# Patient Record
Sex: Male | Born: 1960 | Race: White | Hispanic: No | Marital: Married | Smoking: Current every day smoker
Health system: Southern US, Community
[De-identification: ages and names within clinical notes are randomized; demographics above are authoritative.]

## PROBLEM LIST (undated history)

## (undated) DIAGNOSIS — K2270 Barrett's esophagus without dysplasia: Secondary | ICD-10-CM

## (undated) DIAGNOSIS — K219 Gastro-esophageal reflux disease without esophagitis: Secondary | ICD-10-CM

## (undated) DIAGNOSIS — T8859XA Other complications of anesthesia, initial encounter: Secondary | ICD-10-CM

## (undated) DIAGNOSIS — K589 Irritable bowel syndrome without diarrhea: Secondary | ICD-10-CM

## (undated) DIAGNOSIS — E119 Type 2 diabetes mellitus without complications: Secondary | ICD-10-CM

## (undated) DIAGNOSIS — T4145XA Adverse effect of unspecified anesthetic, initial encounter: Secondary | ICD-10-CM

## (undated) HISTORY — DX: Barrett's esophagus without dysplasia: K22.70

## (undated) HISTORY — DX: Gastro-esophageal reflux disease without esophagitis: K21.9

---

## 1998-03-14 HISTORY — PX: POSTERIOR CERVICAL LAMINECTOMY: SHX2248

## 1999-01-28 ENCOUNTER — Encounter: Payer: Self-pay | Admitting: Neurosurgery

## 1999-01-28 ENCOUNTER — Inpatient Hospital Stay (HOSPITAL_COMMUNITY): Admission: RE | Admit: 1999-01-28 | Discharge: 1999-01-29 | Payer: Self-pay | Admitting: Neurosurgery

## 1999-03-22 ENCOUNTER — Encounter: Admission: RE | Admit: 1999-03-22 | Discharge: 1999-03-22 | Payer: Self-pay | Admitting: Neurosurgery

## 1999-03-22 ENCOUNTER — Encounter: Payer: Self-pay | Admitting: Neurosurgery

## 2001-01-14 ENCOUNTER — Emergency Department (HOSPITAL_COMMUNITY): Admission: EM | Admit: 2001-01-14 | Discharge: 2001-01-14 | Payer: Self-pay | Admitting: Emergency Medicine

## 2009-07-22 ENCOUNTER — Emergency Department (HOSPITAL_COMMUNITY): Admission: EM | Admit: 2009-07-22 | Discharge: 2009-07-22 | Payer: Self-pay | Admitting: Emergency Medicine

## 2010-06-01 LAB — BASIC METABOLIC PANEL
BUN: 15 mg/dL (ref 6–23)
CO2: 24 mEq/L (ref 19–32)
Calcium: 9.4 mg/dL (ref 8.4–10.5)
Chloride: 104 mEq/L (ref 96–112)
Creatinine, Ser: 1.09 mg/dL (ref 0.4–1.5)
GFR calc Af Amer: >60 mL/min (ref >60)
GFR calc non Af Amer: >60 mL/min (ref >60)
Glucose, Bld: 98 mg/dL (ref 70–99)
Potassium: 3.9 mEq/L (ref 3.5–5.1)
Sodium: 136 mEq/L (ref 135–145)

## 2010-06-01 LAB — DIFFERENTIAL
Basophils Absolute: 0.1 10*3/uL (ref 0.0–0.1)
Basophils Relative: 1 % (ref 0–1)
Eosinophils Absolute: 0.2 10*3/uL (ref 0.0–0.7)
Eosinophils Relative: 2 % (ref 0–5)
Lymphocytes Relative: 38 % (ref 12–46)
Lymphs Abs: 3.8 10*3/uL (ref 0.7–4.0)
Monocytes Absolute: 0.8 10*3/uL (ref 0.1–1.0)
Monocytes Relative: 8 % (ref 3–12)
Neutro Abs: 5.1 10*3/uL (ref 1.7–7.7)
Neutrophils Relative %: 51 % (ref 43–77)

## 2010-06-01 LAB — CBC
HCT: 44.1 % (ref 39.0–52.0)
Hemoglobin: 15.5 g/dL (ref 13.0–17.0)
MCHC: 35.1 g/dL (ref 30.0–36.0)
MCV: 94.4 fL (ref 78.0–100.0)
Platelets: 202 10*3/uL (ref 150–400)
RBC: 4.67 MIL/uL (ref 4.22–5.81)
RDW: 12.6 % (ref 11.5–15.5)
WBC: 9.9 10*3/uL (ref 4.0–10.5)

## 2010-06-01 LAB — POCT CARDIAC MARKERS
CKMB, poc: 2.9 ng/mL (ref 1.0–8.0)
Myoglobin, poc: 122 ng/mL (ref 12–200)

## 2010-07-30 NOTE — H&P (Signed)
[place]. [facility]  Patient:    Luis Dickson                       MRN: [id number] Adm. Date:  [id number] Attending:  Colon Branch                         History and Physical  DATE OF BIRTH:  1960/04/22  CHIEF COMPLAINT:  Neck pain, bilateral shoulder pain, and arm numbness.  HISTORY:  The patient is a 50 year old gentleman, who on December 01, 1998, was at work, working on a saw.  What he was sawing got caught by the saw and he yanked his arms and got his arms away and noticed that he had a lot of posterior neck pain and some left shoulder pain.  After a couple of days he had some more severe neck pain going to the left shoulder and then started noticing pain into his right shoulder and his right upper arm, with numbness and some burning-type pain in his right upper extremity.  Since, the more active he is or if he extends his neck, he has a severe amount of neck pain, with pain going into both shoulders.  The pain is worse to the right side than to the left.  He complains of an area of dense numbness ver the biceps and triceps.  He reports that his right arm just does not work as well as it did.  No real problems with doing small things with his hands.  No gait instability.  No clear weakness.  He has been taking Vioxx and Flexeril p.r.n. which is not helping much.  He was placed on a steroid Dosepak and this may have calmed down the symptoms a little.  MRI and cervical spine x-rays were obtained and showed no abnormality, but stenosis was able to be diagnosed best in that and a  number of eye circles of spinal ___________  showing severe circle stenosis from the C3 to 6-7, especially at about 3-4, 4-5, and 5-6.  The 4-5 is the worse and  there is some leg density in the cord suggestive of possible cord contusion there. 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  The patient is being admitted for decompressive surgery.  PAST MEDICAL HISTORY:  Negative.  PREVIOUS OPERATIONS:  None.  CURRENT MEDICATIONS:  Vioxx, Flexeril, and vitamin E.  ALLERGIES:  NAPROSYN caused some stomach upset.  SOCIAL HISTORY:  Married with two children.  Smokes two packs a day.  Does not drink alcohol.  Employed but currently off.  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  REVIEW OF SYSTEMS:  Otherwise negative.  FAMILY HISTORY:  Noncontributory.  PHYSICAL EXAMINATION:  GENERAL:  The patient is in mild distress.  Weight is 267.  Height 75 inches.  VITAL SIGNS:  Blood pressure 121/91, pulse 91, temperature 97.3.  NECK:  Range of motion of the neck is decreased in flexion a little, definitely  decreased in extension, and if he extends too much does have pain shooting into his right upper extremity like an electric shock, but not down his back.  Did not test for Lhermittes or Spurlings.  No brachioplexus tenderness.  A little tenderness  over the right shoulder joint anteriorly.  He has decreased sensation in the upper extremities bilaterally, worse in light touch on the  right, and all distributions worse in pinprick on the left.  Rapid alternating movements are done slowly and  sloppily bilaterally.  No pertinent drift.  Extremities are done fairly well. ait is normal.  Tandem gait is done well.  Deep tendon reflexes are decreased in the upper extremities.  He does have a few beats of clonus bilaterally in the legs.  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  He does have some myelopathic symptoms and some eye circle radicular symptoms.  We will admit the patient for decompressive cervical laminectomy, along with foraminotomies on the right side at 3-4 and 4-5. DD:  01/28/99 TD:  01/28/99 Job: [id number]

## 2011-08-18 ENCOUNTER — Encounter (HOSPITAL_COMMUNITY): Payer: Self-pay | Admitting: *Deleted

## 2011-08-18 ENCOUNTER — Emergency Department (HOSPITAL_COMMUNITY)
Admission: EM | Admit: 2011-08-18 | Discharge: 2011-08-18 | Disposition: A | Discharge status: Home / Self Care | Payer: Medicare Other | Attending: Emergency Medicine | Admitting: Emergency Medicine

## 2011-08-18 DIAGNOSIS — R109 Unspecified abdominal pain: Secondary | ICD-10-CM

## 2011-08-18 DIAGNOSIS — M549 Dorsalgia, unspecified: Secondary | ICD-10-CM | POA: Insufficient documentation

## 2011-08-18 HISTORY — DX: Irritable bowel syndrome, unspecified: K58.9

## 2011-08-18 LAB — COMPREHENSIVE METABOLIC PANEL
ALT: 47 U/L (ref 0–53)
Alkaline Phosphatase: 74 U/L (ref 39–117)
CO2: 26 mEq/L (ref 19–32)
GFR calc Af Amer: 88 mL/min — ABNORMAL LOW (ref >90)
GFR calc non Af Amer: 76 mL/min — ABNORMAL LOW (ref >90)
Glucose, Bld: 90 mg/dL (ref 70–99)
Potassium: 3.9 mEq/L (ref 3.5–5.1)
Sodium: 138 mEq/L (ref 135–145)

## 2011-08-18 LAB — DIFFERENTIAL
Lymphocytes Relative: 32 % (ref 12–46)
Lymphs Abs: 3.3 10*3/uL (ref 0.7–4.0)
Neutrophils Relative %: 58 % (ref 43–77)

## 2011-08-18 LAB — CBC
Platelets: 231 10*3/uL (ref 150–400)
RBC: 4.78 MIL/uL (ref 4.22–5.81)
WBC: 10.6 10*3/uL — ABNORMAL HIGH (ref 4.0–10.5)

## 2011-08-18 MED ORDER — ONDANSETRON HCL 4 MG/2ML IJ SOLN
4.0000 mg | Freq: Once | INTRAMUSCULAR | Status: AC
  Administered 2011-08-18: 4 mg via INTRAVENOUS
  Filled 2011-08-18: qty 2

## 2011-08-18 MED ORDER — GI COCKTAIL ~~LOC~~
30.0000 mL | Freq: Once | ORAL | Status: AC
  Administered 2011-08-18: 30 mL via ORAL
  Filled 2011-08-18: qty 30

## 2011-08-18 MED ORDER — HYDROMORPHONE HCL PF 1 MG/ML IJ SOLN
1.0000 mg | Freq: Once | INTRAMUSCULAR | Status: AC
  Administered 2011-08-18: 1 mg via INTRAVENOUS
  Filled 2011-08-18: qty 1

## 2011-08-18 NOTE — Discharge Instructions (Signed)

## 2011-08-18 NOTE — ED Notes (Signed)
Pt presents with epigastric pain and upper back pain after MRI at 1500 today. Pt states pain started  In lower abdomin during MRI. Pt states MRI was not completed

## 2011-08-18 NOTE — ED Provider Notes (Signed)
History  This chart was scribed for Joya Gaskins, MD by Bennett Scrape. This patient was seen in room APA06/APA06 and the patient's care was started at 5:51PM.  CSN: [id number]  Arrival date & time 08/18/11  1741   First MD Initiated Contact with Patient 08/18/11 1751      Chief Complaint  Patient presents with  . Chest Pain  . Back Pain    Patient is a 51 y.o. male presenting with abdominal pain. The history is provided by the patient. No language interpreter was used.  Abdominal Pain The current episode started 3 to 5 hours ago. The onset of the illness was gradual. The problem has been gradually worsening.  The patient has not had a change in bowel habit. Significant associated medical issues include inflammatory bowel disease. Significant associated medical issues do not include diabetes or gallstones.    Luis Dickson is a 51 y.o. male with a h/o IBS brought in by ambulance, who presents to the Emergency Department complaining of 3 hours of gradual onset, gradually worsening, intermittent epigastric abdominal pain described as a burning sensation that begin in the middle of an open MRI at [facility] in [place]. Pt states that he was having an open MRI without contrast to investigate chronic lower back and hip this afternoon when his face began to feel hot, he became nausead, and had burning epigastric pain. He attributed the first episode to his claustrophobia and had the MRI stopped prematurely. He states that he waited about 5 minutes and then went home after the symptoms subsided. He states that upon arriving home the epigastric pain started again and became more severe. He reports that he tried deep breathing, laying down, and stretching his arms above his head with no improvement in the symptoms. He states that he then started to panic and called EMS. He denies suspect food ingestion as the cause. He reports that he has prior episodes of similar symptoms but not as severe  and he denies having any similar episodes recently. He also c/o mild chest pain currently which he attributes to the panic attack. He denies LOC, chest pain, fevers, emesis, , and SOB as associated symptoms.  He denies having a h/o CVA, heart disease, ulcers, pancreas problems, gallbladder problems and DM. He denies having any prior abdominal surgeries. He is a current everyday smoker but denies alcohol use.   Per Registration, Pt states that GI doctor thinks that he has Crohn's instead of IBS. He states that he usually has 10 to 11 non-bloody diarrhea episodes a day. He reports that he recently started prednisone which cleared up his chronic diarrhea and back pain for 3 days. Wife states that the diarrhea is worse with walking and better with laying down. Pt is [facility] the [facility] for the symptoms but states that he would rather see a GI specialist.  Past Medical History  Diagnosis Date  . IBS (irritable bowel syndrome)   Chronic knee and back pain per pt at bedside  Past Surgical History  Procedure Date  . Posterior cervical laminectomy     No family history on file.  History  Substance Use Topics  . Smoking status: Current Everyday Smoker -- 2.0 packs/day    Types: Cigarettes  . Smokeless tobacco: Not on file  . Alcohol Use: No      Review of Systems  A complete 10 system review of systems was obtained and all systems are negative except as noted in the HPI and  PMH.    Allergies  Review of patient's allergies indicates not on file.  Home Medications  No current outpatient prescriptions on file.  Triage Vitals: Ht 6\' 3"  (1.905 m)  Wt 300 lb (136.079 kg)  BMI 37.50 kg/m2 BP 121/78  Pulse 84  Temp(Src) 98.4 F (36.9 C) (Oral)  Resp 17  Ht 6\' 3"  (1.905 m)  Wt 300 lb (136.079 kg)  BMI 37.50 kg/m2  SpO2 98%   Physical Exam  Nursing note and vitals reviewed.  CONSTITUTIONAL: Well developed/well nourished HEAD AND FACE: Normocephalic/atraumatic EYES: EOMI/PERRL ENMT:  Mucous membranes moist NECK: supple no meningeal signs SPINE:entire spine nontender CV: S1/S2 noted, no murmurs/rubs/gallops noted LUNGS: Lungs are clear to auscultation bilaterally, no apparent distress ABDOMEN: soft, moderate epigastric tenderness, no rebound or guarding, pt is obese GU: no cva tenderness NEURO: Pt is awake/alert, moves all extremitiesx4 EXTREMITIES: pulses normal, full ROM SKIN: warm, color normal PSYCH: no abnormalities of mood noted  ED Course  Procedures   DIAGNOSTIC STUDIES: Oxygen Saturation is 98% on room air, normal by my interpretation.     COORDINATION OF CARE: 6:00PM-Discussed treatment plan of blood work and pain medications with pt and pt agreed to plan. 6:56PM-Pt rechecked and is feeling mildly improved. Discussed GI cocktail with pt and pt agreed to plan. 7:29PM-Discussed discharge plan with pt and pt agreed to plan.  Pt reports long h/o loose stools, nonbloody, no melena.  Reports that he recent took steroids for back pain and this resolved his chronic diarrhea Also, steroids could have caused some abdominal discomfort causing the pain Less likely acute cholecystitis/pancreatitis/ACS/appendicitis.  He is well appearing abd soft and no focal tenderness on repeat exam  The patient appears reasonably screened and/or stabilized for discharge and I doubt any other medical condition or other [facility] requiring further screening, evaluation, or treatment in the ED at this time prior to discharge.    MDM  Nursing notes including past medical history, social history and family history reviewed and considered in documentation All labs/vitals reviewed and considered      Date: 08/18/2011  Rate: 98  Rhythm: normal sinus rhythm  QRS Axis: normal  Intervals: normal  ST/T Wave abnormalities: normal  Conduction Disutrbances:none  Narrative Interpretation:   Old EKG Reviewed: unchanged      I personally performed the services described in this  documentation, which was scribed in my presence. The recorded information has been reviewed and considered.      Joya Gaskins, MD 08/18/11 [id number]

## 2011-08-18 NOTE — ED Notes (Signed)
MD at bedside. Dr Bebe Shaggy at bedside examining pt and discussing plan of care with said pt.

## 2011-08-24 ENCOUNTER — Encounter (HOSPITAL_COMMUNITY): Payer: Self-pay

## 2011-08-24 ENCOUNTER — Other Ambulatory Visit: Payer: Self-pay

## 2011-08-24 ENCOUNTER — Emergency Department (HOSPITAL_COMMUNITY): Payer: Medicare Other

## 2011-08-24 ENCOUNTER — Emergency Department (HOSPITAL_COMMUNITY)
Admission: EM | Admit: 2011-08-24 | Discharge: 2011-08-24 | Disposition: A | Discharge status: Home / Self Care | Payer: Medicare Other | Attending: Emergency Medicine | Admitting: Emergency Medicine

## 2011-08-24 DIAGNOSIS — R079 Chest pain, unspecified: Secondary | ICD-10-CM | POA: Insufficient documentation

## 2011-08-24 DIAGNOSIS — Z7982 Long term (current) use of aspirin: Secondary | ICD-10-CM | POA: Insufficient documentation

## 2011-08-24 DIAGNOSIS — K589 Irritable bowel syndrome without diarrhea: Secondary | ICD-10-CM | POA: Insufficient documentation

## 2011-08-24 DIAGNOSIS — R0602 Shortness of breath: Secondary | ICD-10-CM | POA: Insufficient documentation

## 2011-08-24 DIAGNOSIS — F17200 Nicotine dependence, unspecified, uncomplicated: Secondary | ICD-10-CM | POA: Insufficient documentation

## 2011-08-24 DIAGNOSIS — R110 Nausea: Secondary | ICD-10-CM | POA: Insufficient documentation

## 2011-08-24 DIAGNOSIS — Z79899 Other long term (current) drug therapy: Secondary | ICD-10-CM | POA: Insufficient documentation

## 2011-08-24 LAB — BASIC METABOLIC PANEL
Calcium: 8.7 mg/dL (ref 8.4–10.5)
GFR calc non Af Amer: 78 mL/min — ABNORMAL LOW (ref >90)
Glucose, Bld: 99 mg/dL (ref 70–99)
Sodium: 137 mEq/L (ref 135–145)

## 2011-08-24 LAB — D-DIMER, QUANTITATIVE: D-Dimer, Quant: <0.22 ug/mL-FEU (ref 0.00–0.48)

## 2011-08-24 LAB — CBC
Hemoglobin: 14.6 g/dL (ref 13.0–17.0)
MCH: 32.3 pg (ref 26.0–34.0)
MCHC: 34 g/dL (ref 30.0–36.0)
Platelets: 206 10*3/uL (ref 150–400)

## 2011-08-24 MED ORDER — OXYCODONE-ACETAMINOPHEN 5-325 MG PO TABS: 1.0000 | ORAL_TABLET | Freq: Four times a day (QID) | ORAL | Status: AC | PRN

## 2011-08-24 MED ORDER — KETOROLAC TROMETHAMINE 30 MG/ML IJ SOLN
INTRAMUSCULAR | Status: AC
  Administered 2011-08-24: 30 mg via INTRAVENOUS
  Filled 2011-08-24: qty 1

## 2011-08-24 MED ORDER — KETOROLAC TROMETHAMINE 30 MG/ML IJ SOLN
30.0000 mg | Freq: Once | INTRAMUSCULAR | Status: AC
  Administered 2011-08-24: 30 mg via INTRAVENOUS

## 2011-08-24 MED ORDER — ONDANSETRON HCL 8 MG PO TABS: 8.0000 mg | ORAL_TABLET | ORAL | Status: AC | PRN

## 2011-08-24 MED ORDER — ONDANSETRON HCL 4 MG/2ML IJ SOLN
4.0000 mg | Freq: Once | INTRAMUSCULAR | Status: AC
  Administered 2011-08-24: 4 mg via INTRAVENOUS
  Filled 2011-08-24: qty 2

## 2011-08-24 MED ORDER — MORPHINE SULFATE 4 MG/ML IJ SOLN
4.0000 mg | Freq: Once | INTRAMUSCULAR | Status: AC
  Administered 2011-08-24: 4 mg via INTRAVENOUS
  Filled 2011-08-24: qty 1

## 2011-08-24 NOTE — ED Notes (Signed)
Per ems, pt called them out for chest pain, sob, nausea.  Pt reports the pain began about an hour ago.  Pt appears to be in distress upon arrival.

## 2011-08-24 NOTE — ED Notes (Signed)
Per ems, pt was given 324mg  asa and 2 sl nitroglycerin tabs in route here.  Pt denies any relief.

## 2011-08-24 NOTE — Discharge Instructions (Signed)
Second cardiac enzyme was normal. Discharge home with medicines for pain and nausea. [facility] Try to get a local physician.    return to ER if worse

## 2011-08-24 NOTE — ED Provider Notes (Addendum)
History     CSN: [id number]  Arrival date & time 08/24/11  1709   First MD Initiated Contact with Patient 08/24/11 1717      Chief Complaint  Patient presents with  . Chest Pain  . Shortness of Breath  . Nausea    (Consider location/radiation/quality/duration/timing/severity/associated sxs/prior treatment) HPI....stabbing anterior chest pain a brief time ago. No dyspnea or nausea. Slight diaphoresis. No previous cardiac history. Smokes cigarettes. Pain radiates to both arms and both legs. It is moderate in intensity. Nothing makes it better or worse.  Negative family history for early MI. No cholesterol level. Received nitroglycerin x3 and aspirin via EMS which did not help at all  Past Medical History  Diagnosis Date  . IBS (irritable bowel syndrome)     Past Surgical History  Procedure Date  . Posterior cervical laminectomy     No family history on file.  History  Substance Use Topics  . Smoking status: Current Everyday Smoker -- 2.0 packs/day    Types: Cigarettes  . Smokeless tobacco: Not on file  . Alcohol Use: No      Review of Systems  All other systems reviewed and are negative.    Allergies  Naproxen  Home Medications   Current Outpatient Rx  Name Route Sig Dispense Refill  . ASPIRIN EC 81 MG PO TBEC Oral Take 324 mg by mouth once as needed. FOR CHEST PAIN    . CITALOPRAM HYDROBROMIDE 40 MG PO TABS Oral Take 40 mg by mouth daily.    Marland Kitchen DICYCLOMINE HCL 10 MG PO CAPS Oral Take 10 mg by mouth 3 (three) times daily.    Marland Kitchen HYDROCODONE-ACETAMINOPHEN 5-500 MG PO TABS Oral Take 1 tablet by mouth every 6 (six) hours as needed.    . METHOCARBAMOL 500 MG PO TABS Oral Take 500 mg by mouth 4 (four) times daily as needed. For muscle spasms    . NITROGLYCERIN 0.4 MG SL SUBL Sublingual Place 0.4 mg under the tongue every 5 (five) minutes as needed.    Marland Kitchen ONDANSETRON HCL 8 MG PO TABS Oral Take 1 tablet (8 mg total) by mouth every 4 (four) hours as needed for nausea. 10  tablet 0  . OXYCODONE-ACETAMINOPHEN 5-325 MG PO TABS Oral Take 1-2 tablets by mouth every 6 (six) hours as needed for pain. 15 tablet 0    BP 127/81  Pulse 86  Resp 18  SpO2 99%  Physical Exam  Nursing note and vitals reviewed. Constitutional: He is oriented to person, place, and time. He appears well-developed and well-nourished.  HENT:  Head: Normocephalic and atraumatic.  Eyes: Conjunctivae and EOM are normal. Pupils are equal, round, and reactive to light.  Neck: Normal range of motion. Neck supple.  Cardiovascular: Normal rate and regular rhythm.   Pulmonary/Chest: Effort normal and breath sounds normal.  Abdominal: Soft. Bowel sounds are normal.  Musculoskeletal: Normal range of motion.  Neurological: He is alert and oriented to person, place, and time.  Skin: Skin is warm and dry.  Psychiatric: He has a normal mood and affect.    ED Course  Procedures (including critical care time)  Labs Reviewed  BASIC METABOLIC PANEL - Abnormal; Notable for the following:    Potassium 3.4 (*)     GFR calc non Af Amer 78 (*)     All other components within normal limits  CBC  TROPONIN I  D-DIMER, QUANTITATIVE  TROPONIN I   Dg Chest 2 View  08/24/2011  *RADIOLOGY REPORT*  Clinical Data: Cough. Shortness of breath.  Smoker.  CHEST - 2 VIEW  Comparison: 07/22/2009 and 12/18/2006.  Findings: Poor inspiration with crowding of the markings lung bases more notable right infrahilar region without definitive infiltrate. If there is a persistent unexplained cough, follow-up imaging with cardiac leads removed and better inspiration recommended.  Mild cardiomegaly.  Central pulmonary vascular prominence without frank pulmonary edema.  No pneumothorax.  IMPRESSION: Poor inspiratory exam with crowding of markings infrahilar region greater on the right.  Please see above discussion.  Original Report Authenticated By: Fuller Canada, M.D.     1. Chest pain       MDM  Patient has low risk  factor profile. History is vague. EKG normal. Enzymes x2 negative. Pain much improved.we'll discharge        Donnetta Hutching, MD 09/07/11 Nicholos Johns  Donnetta Hutching, MD 10/19/11 1556

## 2011-08-24 NOTE — ED Notes (Signed)
Pt does not rate pain but states it has decreased

## 2011-08-24 NOTE — ED Notes (Signed)
Alert, in no distress, lying on stretcher; answers questions appropriately; VSS; reports left-sided chest pressure, rates 6/10; skin warm and dry.

## 2011-09-19 ENCOUNTER — Ambulatory Visit: Payer: Medicare Other | Admitting: Urgent Care

## 2011-09-19 ENCOUNTER — Telehealth: Payer: Self-pay | Admitting: Urgent Care

## 2011-09-19 NOTE — Telephone Encounter (Signed)
Pt was a no show

## 2011-09-19 NOTE — Telephone Encounter (Signed)
noted 

## 2012-01-26 ENCOUNTER — Ambulatory Visit (INDEPENDENT_AMBULATORY_CARE_PROVIDER_SITE_OTHER): Payer: Medicare Other | Admitting: Gastroenterology

## 2012-01-26 ENCOUNTER — Encounter: Payer: Self-pay | Admitting: Gastroenterology

## 2012-01-26 VITALS — BP 110/90 | HR 84 | Temp 97.4°F | Ht 75.5 in | Wt 302.0 lb

## 2012-01-26 DIAGNOSIS — R1013 Epigastric pain: Secondary | ICD-10-CM | POA: Insufficient documentation

## 2012-01-26 DIAGNOSIS — R1310 Dysphagia, unspecified: Secondary | ICD-10-CM | POA: Insufficient documentation

## 2012-01-26 DIAGNOSIS — R1314 Dysphagia, pharyngoesophageal phase: Secondary | ICD-10-CM

## 2012-01-26 DIAGNOSIS — R1319 Other dysphagia: Secondary | ICD-10-CM

## 2012-01-26 DIAGNOSIS — R197 Diarrhea, unspecified: Secondary | ICD-10-CM

## 2012-01-26 DIAGNOSIS — K219 Gastro-esophageal reflux disease without esophagitis: Secondary | ICD-10-CM

## 2012-01-26 DIAGNOSIS — K529 Noninfective gastroenteritis and colitis, unspecified: Secondary | ICD-10-CM

## 2012-01-26 DIAGNOSIS — R1011 Right upper quadrant pain: Secondary | ICD-10-CM | POA: Insufficient documentation

## 2012-01-26 DIAGNOSIS — R1012 Left upper quadrant pain: Secondary | ICD-10-CM

## 2012-01-26 LAB — CBC WITH DIFFERENTIAL/PLATELET
Basophils Absolute: 0.1 10*3/uL (ref 0.0–0.1)
Eosinophils Absolute: 0.3 10*3/uL (ref 0.0–0.7)
Eosinophils Relative: 3 % (ref 0–5)
HCT: 45.6 % (ref 39.0–52.0)
MCH: 32.6 pg (ref 26.0–34.0)
MCHC: 34.9 g/dL (ref 30.0–36.0)
MCV: 93.6 fL (ref 78.0–100.0)
Monocytes Absolute: 0.7 10*3/uL (ref 0.1–1.0)
Platelets: 260 10*3/uL (ref 150–400)
RDW: 13.4 % (ref 11.5–15.5)

## 2012-01-26 MED ORDER — DEXLANSOPRAZOLE 60 MG PO CPDR: 60.0000 mg | DELAYED_RELEASE_CAPSULE | Freq: Every day | ORAL | Status: DC

## 2012-01-26 NOTE — Progress Notes (Signed)
Primary Care Physician:  Provider Not In System  Primary Gastroenterologist:  Michael Rourk, MD   Chief Complaint  Patient presents with  . Abdominal Pain    HPI:  Luis Dickson is a 50 y.o. male here for further evaluation of abdominal pain. He was seen in ED in 08/2011. Patient no showed his first appointment here in 09/2011.    Epigastric pain stabbing/burning and into LUQ. Hard to distinguish from heart, although he denies any h/o heart disease. Lately he has noted hot/cold flashes with pain. Blood pressures goes up with pain. Pain severe and excruciated. Lasts for hours at a time. States he has had GI problems since 1988. [facility] He denies recent diagnostic studies.   Seen in ED in 08/2011. Given GI cocktail, pain medication. W/U as outlined below. Seen in ED approximately a week later with chest pain. Again, EKG, labs, CXR unremarkable. Takes Gaviscon prn but not helping. Tried omeprazole couple of times but seems like it gets worse. C/O heartburn and belching. Feels nauseated. Nocturnal symptoms, related to meals and without meals. Twice symptoms have occurred after MRI. Patient states he ended up in ED after both MRIs but admits to severe claustrophobia and anxiety related. He feels like food sticks on epigastrium. Always feels full. Changed diet to low-carb several weeks ago. For breakfast he eats Eggs/spinach, supper (veggies/meat), fruit for snacks. Dropped bread, dairy, chips. No change in his upper abd pain. Happens every day, first thing in morning and at nighttime. Chronic diarrhea, used to go 10-15 times in AM. BMs better since low-carb. Interestingly, he recently took Prednisone for five days for other reasons and his BMs became normal and abdominal pain went away. More solid stools with diet change. Not on Colestid right now.    Current Outpatient Prescriptions  Medication Sig Dispense Refill  . aluminum hydroxide-magnesium carbonate  (GAVISCON) 95-358 MG/15ML SUSP Take 30 mLs by mouth 3 (three) times daily after meals.      . aspirin EC 81 MG tablet Take 324 mg by mouth once as needed. FOR CHEST PAIN      . baclofen (LIORESAL) 10 MG tablet Take 10 mg by mouth 3 (three) times daily.      . colestipol (COLESTID) 1 G tablet Take 1 g by mouth 2 (two) times daily.      . HYDROcodone-acetaminophen (VICODIN) 5-500 MG per tablet Take 1 tablet by mouth every 6 (six) hours as needed.      . hydrOXYzine (VISTARIL) 50 MG capsule Take 50 mg by mouth 3 (three) times daily as needed.      . Vitamin D, Ergocalciferol, (DRISDOL) 50000 UNITS CAPS Take 50,000 Units by mouth every 7 (seven) days.        Allergies as of 01/26/2012 - Review Complete 01/26/2012  Allergen Reaction Noted  . Naproxen  08/18/2011    Past Medical History  Diagnosis Date  . IBS (irritable bowel syndrome)   . GERD (gastroesophageal reflux disease)     Past Surgical History  Procedure Date  . Posterior cervical laminectomy 2000    Family History  Problem Relation Age of Onset  . Colon cancer Neg Hx   . Liver disease Neg Hx   . Inflammatory bowel disease Neg Hx     History   Social History  . Marital Status: Married    Spouse Name: N/A    Number of Children: N/A  . Years of Education: N/A   Occupational   History  . Not on file.   Social History Main Topics  . Smoking status: Current Every Day Smoker -- 2.0 packs/day    Types: Cigarettes  . Smokeless tobacco: Not on file  . Alcohol Use: No  . Drug Use: No  . Sexually Active:    Other Topics Concern  . Not on file   Social History Narrative  . No narrative on file      ROS:  General: Negative for anorexia, weight loss, fever, chills, fatigue, weakness. Eyes: Negative for vision changes.  ENT: Negative for hoarseness, nasal congestion. CV: Negative for angina, palpitations, dyspnea on exertion, peripheral edema. See hpi. Respiratory: Negative for dyspnea at rest, dyspnea on exertion,  cough, sputum, wheezing.  GI: See history of present illness. GU:  Negative for dysuria, hematuria, urinary incontinence, urinary frequency, nocturnal urination.  MS: Negative for joint pain, low back pain.  Derm: Negative for rash or itching.  Neuro: Negative for weakness, abnormal sensation, seizure, frequent headaches, memory loss, confusion.  Psych: Negative for depression, suicidal ideation, hallucinations. Some anxiety with severe pain. Endo: Negative for unusual weight change.  Heme: Negative for bruising or bleeding. Allergy: Negative for rash or hives.    Physical Examination:  BP 127/80  Pulse 84  Temp 97.4 F (36.3 C) (Temporal)  Ht 6' 3.5" (1.918 m)  Wt 302 lb (136.986 kg)  BMI 37.25 kg/m2   General: Well-nourished, well-developed in no acute distress. Accompanied by spouse. Visibly in pain while in the office. Head: Normocephalic, atraumatic.   Eyes: Conjunctiva pink, no icterus. Mouth: Oropharyngeal mucosa moist and pink , no lesions erythema or exudate. Neck: Supple without thyromegaly, masses, or lymphadenopathy.  Lungs: Clear to auscultation bilaterally.  Heart: Regular rate and rhythm, no murmurs rubs or gallops.  Abdomen: Bowel sounds are normal, moderate epigastric/LUQ/RUQ tenderness, nondistended, no hepatosplenomegaly or masses, no abdominal bruits or    hernia , no rebound or guarding.   Rectal: not performed Extremities: No lower extremity edema. No clubbing or deformities.  Neuro: Alert and oriented x 4 , grossly normal neurologically.  Skin: Warm and dry, no rash or jaundice.   Psych: Alert and cooperative, normal mood and affect.  Labs: Lab Results  Component Value Date   CREATININE 1.10 08/24/2011   BUN 14 08/24/2011   NA 137 08/24/2011   K 3.4* 08/24/2011   CL 102 08/24/2011   CO2 25 08/24/2011   Lab Results  Component Value Date   WBC 8.2 08/24/2011   HGB 14.6 08/24/2011   HCT 42.9 08/24/2011   MCV 94.9 08/24/2011   PLT 206 08/24/2011   Lab  Results  Component Value Date   ALT 47 08/18/2011   AST 26 08/18/2011   ALKPHOS 74 08/18/2011   BILITOT 0.5 08/18/2011   Lab Results  Component Value Date   LIPASE 31 08/18/2011   Lab Results  Component Value Date   TROPONINI <0.30 08/24/2011      Imaging Studies: No results found.    

## 2012-01-26 NOTE — Assessment & Plan Note (Signed)
Chronic diarrhea, labeled as IBS years ago. Patient denies significant work-up. We will rule out celiac disease as diarrhea has improved with elimination of bread products. He will likely have colonoscopy in near future if CT unremarkable. We can rule out IBD, microscopic colitis. I find it interesting that his diarrhea resolved while on prednisone for musculoskeletal issues. Await CT and labs. Further recommendations to follow.   See epigastric pain.

## 2012-01-26 NOTE — Assessment & Plan Note (Signed)
Multiple GI issues. Severe, intermittent but daily epigastric pain with radiation into both RUQ/LUQ and often into left chest. Associated with nausea, hot/cold flashes, anxiety. Labs during episodes were unremarkable. EKG and cardiac enzymes negative during ED visits with this pain. Ddx: ?volvulus, biliary, less likely PUD/GERD given severity /chronicity but cannot exclude, carcinoid tumor. He does clearly have some GERD and esophageal dysphagia. Will start Dexilant 60mg  daily, samples provided.  We will recheck labs including CMET, lipase, CBC, celiac labs for abdominal pain and chronic diarrhea as well.   CT A/P with contrast ASAP. If unremarkable, he will need both an EGD/colonoscopy.  We have requested copies of old EGD/colonoscopy reports from the [place] system for review.

## 2012-01-26 NOTE — Patient Instructions (Signed)
Please start Dexilant one capsule daily before breakfast. Samples provided. This will cover for stomach ulcers, gastritis, acid reflux. Please have your lab work done today. We have scheduled you for a CT scan of your abdomen to evaluate your symptoms.

## 2012-01-27 LAB — COMPREHENSIVE METABOLIC PANEL
ALT: 39 U/L (ref 0–53)
AST: 24 U/L (ref 0–37)
BUN: 12 mg/dL (ref 6–23)
Calcium: 9.6 mg/dL (ref 8.4–10.5)
Chloride: 104 mEq/L (ref 96–112)
Creat: 1.11 mg/dL (ref 0.50–1.35)
Total Bilirubin: 0.6 mg/dL (ref 0.3–1.2)

## 2012-01-27 LAB — TISSUE TRANSGLUTAMINASE, IGA: Tissue Transglutaminase Ab, IgA: 6.6 U/mL (ref <20)

## 2012-01-27 NOTE — Progress Notes (Signed)
REVIEWED.  

## 2012-01-27 NOTE — Progress Notes (Signed)
PCP not in system

## 2012-01-27 NOTE — Progress Notes (Signed)
Quick Note:  All labs look good. Still waiting for celiac labs. CT as planned. ______

## 2012-01-30 ENCOUNTER — Ambulatory Visit (HOSPITAL_COMMUNITY)
Admission: RE | Admit: 2012-01-30 | Discharge: 2012-01-30 | Disposition: A | Discharge status: Home / Self Care | Payer: Medicare Other | Source: Ambulatory Visit | Attending: Gastroenterology | Admitting: Gastroenterology

## 2012-01-30 DIAGNOSIS — K529 Noninfective gastroenteritis and colitis, unspecified: Secondary | ICD-10-CM

## 2012-01-30 DIAGNOSIS — R1012 Left upper quadrant pain: Secondary | ICD-10-CM

## 2012-01-30 DIAGNOSIS — K7689 Other specified diseases of liver: Secondary | ICD-10-CM | POA: Insufficient documentation

## 2012-01-30 DIAGNOSIS — R1011 Right upper quadrant pain: Secondary | ICD-10-CM

## 2012-01-30 DIAGNOSIS — R1013 Epigastric pain: Secondary | ICD-10-CM

## 2012-01-30 DIAGNOSIS — R197 Diarrhea, unspecified: Secondary | ICD-10-CM | POA: Insufficient documentation

## 2012-01-30 MED ORDER — IOHEXOL 300 MG/ML  SOLN
100.0000 mL | Freq: Once | INTRAMUSCULAR | Status: AC | PRN
  Administered 2012-01-30: 100 mL via INTRAVENOUS

## 2012-02-01 ENCOUNTER — Other Ambulatory Visit: Payer: Self-pay | Admitting: Internal Medicine

## 2012-02-01 ENCOUNTER — Telehealth: Payer: Self-pay | Admitting: Internal Medicine

## 2012-02-01 DIAGNOSIS — R1010 Upper abdominal pain, unspecified: Secondary | ICD-10-CM

## 2012-02-01 DIAGNOSIS — K529 Noninfective gastroenteritis and colitis, unspecified: Secondary | ICD-10-CM

## 2012-02-01 DIAGNOSIS — R1310 Dysphagia, unspecified: Secondary | ICD-10-CM

## 2012-02-01 DIAGNOSIS — K219 Gastro-esophageal reflux disease without esophagitis: Secondary | ICD-10-CM

## 2012-02-01 NOTE — Progress Notes (Signed)
Quick Note:  Pt is aware. ______ 

## 2012-02-01 NOTE — Progress Notes (Signed)
Quick Note:  Nothing to explain abdominal pain or diarrhea.  Please schedule patient for an EGD with dilation and colonoscopy with Dr. Jena Gauss. Dx: chronic diarrhea, esophageal dysphagia, gerd, upper abd pain. ______

## 2012-02-01 NOTE — Telephone Encounter (Signed)
I have LMOM for patient I have him scheduled for EGD/ED on Thursday Nov 5th and I have put his instructions in the mail to him

## 2012-02-01 NOTE — Progress Notes (Signed)
Quick Note:  Pt aware, he will agree to have an egd done now, he doesn't want to have tcs done. He stated the prep makes him vomit, I informed him that we could try a different prep, but he still did not want to have it done right now.   Leighann, please schedule. ______

## 2012-02-01 NOTE — Progress Notes (Signed)
Quick Note:  All labs are normal. See CT result note. ______

## 2012-02-08 NOTE — Progress Notes (Signed)
Quick Note:    Noted    ______

## 2012-02-16 ENCOUNTER — Encounter (HOSPITAL_COMMUNITY): Payer: Self-pay | Admitting: *Deleted

## 2012-02-16 ENCOUNTER — Encounter (HOSPITAL_COMMUNITY)
Admission: RE | Disposition: A | Discharge status: Home / Self Care | Payer: Self-pay | Source: Ambulatory Visit | Attending: Internal Medicine

## 2012-02-16 ENCOUNTER — Ambulatory Visit (HOSPITAL_COMMUNITY)
Admission: RE | Admit: 2012-02-16 | Discharge: 2012-02-16 | Disposition: A | Discharge status: Home / Self Care | Payer: Medicare Other | Source: Ambulatory Visit | Attending: Internal Medicine | Admitting: Internal Medicine

## 2012-02-16 DIAGNOSIS — K2990 Gastroduodenitis, unspecified, without bleeding: Secondary | ICD-10-CM

## 2012-02-16 DIAGNOSIS — K228 Other specified diseases of esophagus: Secondary | ICD-10-CM

## 2012-02-16 DIAGNOSIS — K2270 Barrett's esophagus without dysplasia: Secondary | ICD-10-CM | POA: Insufficient documentation

## 2012-02-16 DIAGNOSIS — K2970 Gastritis, unspecified, without bleeding: Secondary | ICD-10-CM

## 2012-02-16 DIAGNOSIS — K219 Gastro-esophageal reflux disease without esophagitis: Secondary | ICD-10-CM

## 2012-02-16 DIAGNOSIS — R1310 Dysphagia, unspecified: Secondary | ICD-10-CM

## 2012-02-16 DIAGNOSIS — K529 Noninfective gastroenteritis and colitis, unspecified: Secondary | ICD-10-CM

## 2012-02-16 DIAGNOSIS — R1010 Upper abdominal pain, unspecified: Secondary | ICD-10-CM

## 2012-02-16 HISTORY — DX: Other complications of anesthesia, initial encounter: T88.59XA

## 2012-02-16 HISTORY — PX: ESOPHAGOGASTRODUODENOSCOPY (EGD) WITH ESOPHAGEAL DILATION: SHX5812

## 2012-02-16 HISTORY — DX: Adverse effect of unspecified anesthetic, initial encounter: T41.45XA

## 2012-02-16 SURGERY — ESOPHAGOGASTRODUODENOSCOPY (EGD) WITH ESOPHAGEAL DILATION
Anesthesia: Moderate Sedation

## 2012-02-16 MED ORDER — STERILE WATER FOR IRRIGATION IR SOLN
Status: DC | PRN
  Administered 2012-02-16: 14:00:00

## 2012-02-16 MED ORDER — SODIUM CHLORIDE 0.45 % IV SOLN
INTRAVENOUS | Status: DC
  Administered 2012-02-16: 13:00:00 via INTRAVENOUS

## 2012-02-16 MED ORDER — MIDAZOLAM HCL 5 MG/5ML IJ SOLN
INTRAMUSCULAR | Status: AC
  Filled 2012-02-16: qty 10

## 2012-02-16 MED ORDER — MIDAZOLAM HCL 5 MG/5ML IJ SOLN
INTRAMUSCULAR | Status: DC | PRN
  Administered 2012-02-16 (×2): 1 mg via INTRAVENOUS
  Administered 2012-02-16 (×2): 2 mg via INTRAVENOUS
  Administered 2012-02-16: 1 mg via INTRAVENOUS

## 2012-02-16 MED ORDER — FENTANYL CITRATE 0.05 MG/ML IJ SOLN
INTRAMUSCULAR | Status: AC
  Filled 2012-02-16: qty 4

## 2012-02-16 MED ORDER — FENTANYL CITRATE 0.05 MG/ML IJ SOLN
INTRAMUSCULAR | Status: DC | PRN
  Administered 2012-02-16 (×2): 50 ug via INTRAVENOUS

## 2012-02-16 MED ORDER — BUTAMBEN-TETRACAINE-BENZOCAINE 2-2-14 % EX AERO
INHALATION_SPRAY | CUTANEOUS | Status: DC | PRN
  Administered 2012-02-16: 2 via TOPICAL

## 2012-02-16 NOTE — H&P (View-Only) (Signed)
Primary Care Physician:  Provider Not In System  Primary Gastroenterologist:  Roetta Sessions, MD   Chief Complaint  Patient presents with  . Abdominal Pain    HPI:  Luis Dickson is a 51 y.o. male here for further evaluation of abdominal pain. He was seen in ED in 08/2011. Patient no showed his first appointment here in 09/2011.    Epigastric pain stabbing/burning and into LUQ. Hard to distinguish from heart, although he denies any h/o heart disease. Lately he has noted hot/cold flashes with pain. Blood pressures goes up with pain. Pain severe and excruciated. Lasts for hours at a time. States he has had GI problems since 1988. Prior w/u remotely included EGD/TCS in the [place] system. He denies recent diagnostic studies.   Seen in ED in 08/2011. Given GI cocktail, pain medication. W/U as outlined below. Seen in ED approximately a week later with chest pain. Again, EKG, labs, CXR unremarkable. Takes Gaviscon prn but not helping. Tried omeprazole couple of times but seems like it gets worse. C/O heartburn and belching. Feels nauseated. Nocturnal symptoms, related to meals and without meals. Twice symptoms have occurred after MRI. Patient states he ended up in ED after both MRIs but admits to severe claustrophobia and anxiety related. He feels like food sticks on epigastrium. Always feels full. Changed diet to low-carb several weeks ago. For breakfast he eats Eggs/spinach, supper (veggies/meat), fruit for snacks. Dropped bread, dairy, chips. No change in his upper abd pain. Happens every day, first thing in morning and at nighttime. Chronic diarrhea, used to go 10-15 times in AM. BMs better since low-carb. Interestingly, he recently took Prednisone for five days for other reasons and his BMs became normal and abdominal pain went away. More solid stools with diet change. Not on Colestid right now.    Current Outpatient Prescriptions  Medication Sig Dispense Refill  . aluminum hydroxide-magnesium carbonate  (GAVISCON) 95-358 MG/15ML SUSP Take 30 mLs by mouth 3 (three) times daily after meals.      Marland Kitchen aspirin EC 81 MG tablet Take 324 mg by mouth once as needed. FOR CHEST PAIN      . baclofen (LIORESAL) 10 MG tablet Take 10 mg by mouth 3 (three) times daily.      . colestipol (COLESTID) 1 G tablet Take 1 g by mouth 2 (two) times daily.      Marland Kitchen HYDROcodone-acetaminophen (VICODIN) 5-500 MG per tablet Take 1 tablet by mouth every 6 (six) hours as needed.      . hydrOXYzine (VISTARIL) 50 MG capsule Take 50 mg by mouth 3 (three) times daily as needed.      . Vitamin D, Ergocalciferol, (DRISDOL) 50000 UNITS CAPS Take 50,000 Units by mouth every 7 (seven) days.        Allergies as of 01/26/2012 - Review Complete 01/26/2012  Allergen Reaction Noted  . Naproxen  08/18/2011    Past Medical History  Diagnosis Date  . IBS (irritable bowel syndrome)   . GERD (gastroesophageal reflux disease)     Past Surgical History  Procedure Date  . Posterior cervical laminectomy 2000    Family History  Problem Relation Age of Onset  . Colon cancer Neg Hx   . Liver disease Neg Hx   . Inflammatory bowel disease Neg Hx     History   Social History  . Marital Status: Married    Spouse Name: N/A    Number of Children: N/A  . Years of Education: N/A   Occupational  History  . Not on file.   Social History Main Topics  . Smoking status: Current Every Day Smoker -- 2.0 packs/day    Types: Cigarettes  . Smokeless tobacco: Not on file  . Alcohol Use: No  . Drug Use: No  . Sexually Active:    Other Topics Concern  . Not on file   Social History Narrative  . No narrative on file      ROS:  General: Negative for anorexia, weight loss, fever, chills, fatigue, weakness. Eyes: Negative for vision changes.  ENT: Negative for hoarseness, nasal congestion. CV: Negative for angina, palpitations, dyspnea on exertion, peripheral edema. See hpi. Respiratory: Negative for dyspnea at rest, dyspnea on exertion,  cough, sputum, wheezing.  GI: See history of present illness. GU:  Negative for dysuria, hematuria, urinary incontinence, urinary frequency, nocturnal urination.  MS: Negative for joint pain, low back pain.  Derm: Negative for rash or itching.  Neuro: Negative for weakness, abnormal sensation, seizure, frequent headaches, memory loss, confusion.  Psych: Negative for depression, suicidal ideation, hallucinations. Some anxiety with severe pain. Endo: Negative for unusual weight change.  Heme: Negative for bruising or bleeding. Allergy: Negative for rash or hives.    Physical Examination:  BP 127/80  Pulse 84  Temp 97.4 F (36.3 C) (Temporal)  Ht 6' 3.5" (1.918 m)  Wt 302 lb (136.986 kg)  BMI 37.25 kg/m2   General: Well-nourished, well-developed in no acute distress. Accompanied by spouse. Visibly in pain while in the office. Head: Normocephalic, atraumatic.   Eyes: Conjunctiva pink, no icterus. Mouth: Oropharyngeal mucosa moist and pink , no lesions erythema or exudate. Neck: Supple without thyromegaly, masses, or lymphadenopathy.  Lungs: Clear to auscultation bilaterally.  Heart: Regular rate and rhythm, no murmurs rubs or gallops.  Abdomen: Bowel sounds are normal, moderate epigastric/LUQ/RUQ tenderness, nondistended, no hepatosplenomegaly or masses, no abdominal bruits or    hernia , no rebound or guarding.   Rectal: not performed Extremities: No lower extremity edema. No clubbing or deformities.  Neuro: Alert and oriented x 4 , grossly normal neurologically.  Skin: Warm and dry, no rash or jaundice.   Psych: Alert and cooperative, normal mood and affect.  Labs: Lab Results  Component Value Date   CREATININE 1.10 08/24/2011   BUN 14 08/24/2011   NA 137 08/24/2011   K 3.4* 08/24/2011   CL 102 08/24/2011   CO2 25 08/24/2011   Lab Results  Component Value Date   WBC 8.2 08/24/2011   HGB 14.6 08/24/2011   HCT 42.9 08/24/2011   MCV 94.9 08/24/2011   PLT 206 08/24/2011   Lab  Results  Component Value Date   ALT 47 08/18/2011   AST 26 08/18/2011   ALKPHOS 74 08/18/2011   BILITOT 0.5 08/18/2011   Lab Results  Component Value Date   LIPASE 31 08/18/2011   Lab Results  Component Value Date   TROPONINI <0.30 08/24/2011      Imaging Studies: No results found.

## 2012-02-16 NOTE — Interval H&P Note (Signed)
History and Physical Interval Note:  02/16/2012 1:34 PM  Luis Dickson  has presented today for surgery, with the diagnosis of Esophageal dysphagia, gerd, upper abdominal pain and chronic diarrhea  The various methods of treatment have been discussed with the patient and family. After consideration of risks, benefits and other options for treatment, the patient has consented to  Procedure(s) (LRB) with comments: ESOPHAGOGASTRODUODENOSCOPY (EGD) WITH ESOPHAGEAL DILATION (N/A) - 1:30 as a surgical intervention .  The patient's history has been reviewed, patient examined, no change in status, stable for surgery.  I have reviewed the patient's chart and labs.  Questions were answered to the patient's satisfaction.     Luis Dickson  Patient states Dexilant made symptoms of reflux and dysphagia worse. Diarrhea appears to have resolved. EGD with possible esophageal dilation per plan.The risks, benefits, limitations, alternatives and imponderables have been reviewed with the patient. Potential for esophageal dilation, biopsy, etc. have also been reviewed.  Questions have been answered. All parties agreeable.

## 2012-02-16 NOTE — Op Note (Addendum)
[facility] [address] [place], [address]   ENDOSCOPY PROCEDURE REPORT  PATIENT: Luis Dickson, Luis Dickson  MR#: [id number] BIRTHDATE: [birth year] , 50  yrs. old GENDER: Male ENDOSCOPIST: R.  Roetta Sessions, MD [facility] REFERRED BY: PROCEDURE DATE:  02/16/2012 PROCEDURE:    EGD with Elease Hashimoto dilation followed by esophageal and gastric biopsy  INDICATIONS:      GERD and esophageal dysphagia. Recent diarrhea-resolved. Patient declines colonoscopy. CT scan and celiac serologies negative.  Patient states a course of Dexilant have made symptoms WORSE.  INFORMED CONSENT:   The risks, benefits, limitations, alternatives and imponderables have been discussed.  The potential for biopsy, esophogeal dilation, etc. have also been reviewed.  Questions have been answered.  All parties agreeable.  Please see the history and physical in the medical record for more information.  MEDICATIONS:  Versed 7 mg IV and  Fentanyl 100 mcg IV in divided doses. Cetacaine spray.  DESCRIPTION OF PROCEDURE:   The EG-[id number] ([id number])  endoscope was introduced through the mouth and advanced to the second portion of the duodenum without difficulty or limitations.  The mucosal surfaces were surveyed very carefully during advancement of the scope and upon withdrawal.  Retroflexion view of the proximal stomach and esophagogastric junction was performed.      FINDINGS:Accentuated undulating Z line. No esophagitis. No ringing or firming of the esophagus. Tubular esophagus appeared patent throughout its course. Stomach empty. Multiple gastric (antral) erosions. No ulcer or infiltrating process. Lorcet patent examination of bulb second portion revealed the bulb or erosions scattered diffusely; otherwise, the first and second portion duodenum appeared normal.  THERAPEUTIC / DIAGNOSTIC MANEUVERS PERFORMED:  A 56 French Maloney dilator was passed to full insertion easily. A look back revealed no  apparent complication related to this maneuver. Subsequently, biopsies of the distal esophagus were taken for histologic study. Finally, biopsies abnormal antrum were taken for histologic study.   COMPLICATIONS:  None  IMPRESSION:  Accentuated undulating Z line versus short segment Barrett's esophagus-status post biopsy after passage of a Maloney dilator. Antral and bulbar erosions of uncertain significance-status post biopsy.  RECOMMENDATIONS:    Begin Carafate suspension 1 g 4 times a day. Follow pathology.  Stop Dexilant. Further recommendations to follow.    _______________________________ R. Roetta Sessions, MD FACP [facility] eSigned:  R. Roetta Sessions, MD FACP [facility] 02/16/2012 2:13 PM Revised: 02/16/2012 2:13 PM    CC:  PATIENT NAME:  Luis Dickson, Luis Dickson MR#: [id number]

## 2012-02-20 ENCOUNTER — Encounter: Payer: Self-pay | Admitting: Internal Medicine

## 2012-02-21 ENCOUNTER — Encounter: Payer: Self-pay | Admitting: *Deleted

## 2012-02-21 ENCOUNTER — Encounter (HOSPITAL_COMMUNITY): Payer: Self-pay | Admitting: Internal Medicine

## 2012-02-28 ENCOUNTER — Encounter: Payer: Self-pay | Admitting: Internal Medicine

## 2012-03-12 ENCOUNTER — Encounter: Payer: Self-pay | Admitting: Internal Medicine

## 2012-03-13 ENCOUNTER — Ambulatory Visit (INDEPENDENT_AMBULATORY_CARE_PROVIDER_SITE_OTHER): Payer: Medicare Other | Admitting: Urgent Care

## 2012-03-13 ENCOUNTER — Encounter: Payer: Self-pay | Admitting: Urgent Care

## 2012-03-13 VITALS — BP 141/91 | HR 98 | Temp 97.2°F | Ht 76.0 in | Wt 312.0 lb

## 2012-03-13 DIAGNOSIS — K219 Gastro-esophageal reflux disease without esophagitis: Secondary | ICD-10-CM

## 2012-03-13 DIAGNOSIS — K2270 Barrett's esophagus without dysplasia: Secondary | ICD-10-CM

## 2012-03-13 DIAGNOSIS — R110 Nausea: Secondary | ICD-10-CM

## 2012-03-13 MED ORDER — DEXLANSOPRAZOLE 60 MG PO CPDR: 60.0000 mg | DELAYED_RELEASE_CAPSULE | Freq: Every day | ORAL | Status: DC

## 2012-03-13 NOTE — Progress Notes (Signed)
Primary Care Physician:  Provider Not In System Primary Gastroenterologist:  Dr. Jena Gauss  Chief Complaint  Patient presents with  . Follow-up    HPI:  Luis Dickson is a 51 y.o. male here for follow up for Barrett's esophagus. Recent EGD by Dr. Jena Gauss revealed short segment Barrett's esophagus.  He has history of chronic GERD. He was started on Dexilant 60 mg, however he ran out of samples. While on Dexilant and he felt 40% better.  He has tried omeprazole 20 mg daily and did not see much difference. He has chronic belching.  C/o nausea every AM that last until 2pm.  He eats dinner 6pm, but snacks most nights until 10pm.  He does not exercise. He drinks large amounts of coffee & mountain dew every day.  He has decreased his fried food intake.  He continues to smoke daily. He states he needs to smoke as it helps his anxiety.  Still has some rare dysphagia w/ solid foods.  BM normal.  Denies rectal bleeding.  Weight stable.  Appetite off due to nausea in AM.  Occasional regurgitation.  He takes rare Baclofen for chronic neck pain.  Past Medical History  Diagnosis Date  . IBS (irritable bowel syndrome)   . GERD (gastroesophageal reflux disease)   . Complication of anesthesia     pt became very agitated last time he was given anesthesia, he woke up in restraints per pt  . Barrett's esophagus     Past Surgical History  Procedure Date  . Posterior cervical laminectomy 2000  . Esophagogastroduodenoscopy (egd) with esophageal dilation 02/16/2012    RMR: short segment Barrett's esophagus    Current Outpatient Prescriptions  Medication Sig Dispense Refill  . aluminum hydroxide-magnesium carbonate (GAVISCON) 95-358 MG/15ML SUSP Take 30 mLs by mouth 3 (three) times daily after meals.      Marland Kitchen aspirin EC 81 MG tablet Take 324 mg by mouth once as needed. FOR CHEST PAIN      . baclofen (LIORESAL) 10 MG tablet Take 10 mg by mouth 3 (three) times daily.      . colestipol (COLESTID) 1 G tablet Take 1 g by  mouth 2 (two) times daily.      Marland Kitchen HYDROcodone-acetaminophen (VICODIN) 5-500 MG per tablet Take 1 tablet by mouth every 6 (six) hours as needed.      . hydrOXYzine (VISTARIL) 50 MG capsule Take 50 mg by mouth 3 (three) times daily as needed.      . Vitamin D, Ergocalciferol, (DRISDOL) 50000 UNITS CAPS Take 50,000 Units by mouth every 7 (seven) days.      Marland Kitchen dexlansoprazole (DEXILANT) 60 MG capsule Take 1 capsule (60 mg total) by mouth daily before breakfast.  31 capsule  11  . dexlansoprazole (DEXILANT) 60 MG capsule Take 1 capsule (60 mg total) by mouth daily.  30 capsule  0  . sucralfate (CARAFATE) 1 GM/10ML suspension Take 1 g by mouth 4 (four) times daily.        Allergies as of 03/13/2012 - Review Complete 03/13/2012  Allergen Reaction Noted  . Demerol (meperidine) Nausea Only 02/16/2012  . Naproxen  08/18/2011    Review of Systems: See HPI, otherwise negative ROS  Physical Exam: BP 141/91  Pulse 98  Temp 97.2 F (36.2 C) (Oral)  Ht 6\' 4"  (1.93 m)  Wt 312 lb (141.522 kg)  BMI 37.98 kg/m2 General:   Alert,  obese, pleasant and cooperative in NAD.  Accompanied by his wife. Eyes:  Sclera clear,  no icterus.   Conjunctiva pink. Mouth:  No deformity or lesions, oropharynx pink and moist. Neck:  Supple; no masses or thyromegaly. Heart:  Regular rate and rhythm; no murmurs, clicks, rubs,  or gallops. Abdomen:  Protuberant.  Normal bowel sounds.  No bruits.  Soft, non-tender and non-distended.  Small easily reducible umbilical hernia.  No hepatosplenomegaly or masses noted.  Exam limited given body habitus.  No guarding or rebound tenderness.   Rectal:  Deferred. Msk:  Symmetrical without gross deformities.  Pulses:  Normal pulses noted. Extremities:  No edema. Neurologic:  Alert and oriented x4;  grossly normal neurologically. Skin:  Intact without significant lesions or rashes.

## 2012-03-13 NOTE — Progress Notes (Signed)
PCP not on file

## 2012-03-13 NOTE — Assessment & Plan Note (Signed)
Luis Dickson is a pleasant 51 y.o. male with short segment Barrett's esophagus.  Currently not on PPI. Resume Dexilant 60 mg daily. If this is ineffective, he will call and we can try another PPI.

## 2012-03-13 NOTE — Assessment & Plan Note (Signed)
Poorly controlled, not on daily PPI. Chronic a.m. nausea. It is possible he may have gastroparesis contributing to his symptoms. He needs to make some serious lifestyle modifications and we discussed this today including dietary discretion, weight loss, quitting smoking, and exercise.  Begin Dexilant 60 mg daily for acid reflux.   Gastric emptying study 1-800-QUIT-NOW for help quitting smoking GERD, GERD diet, Barretts literature provided  As a side note, he is due for screening colonoscopy December 2014

## 2012-03-13 NOTE — Patient Instructions (Addendum)
Begin Dexilant 60 mg daily for acid reflux.  If no better, call me back and we will try different medicine. Gastric emptying study 1-800-QUIT-NOW for help quitting smoking You will need a screening colonoscopy and followup EGD for your Barrett's December 2014 Gastroesophageal Reflux Disease, Adult Gastroesophageal reflux disease (GERD) happens when acid from your stomach flows up into the esophagus. When acid comes in contact with the esophagus, the acid causes soreness (inflammation) in the esophagus. Over time, GERD may create small holes (ulcers) in the lining of the esophagus. CAUSES   Increased body weight. This puts pressure on the stomach, making acid rise from the stomach into the esophagus.  Smoking. This increases acid production in the stomach.  Drinking alcohol. This causes decreased pressure in the lower esophageal sphincter (valve or ring of muscle between the esophagus and stomach), allowing acid from the stomach into the esophagus.  Late evening meals and a full stomach. This increases pressure and acid production in the stomach.  A malformed lower esophageal sphincter. Sometimes, no cause is found. SYMPTOMS   Burning pain in the lower part of the mid-chest behind the breastbone and in the mid-stomach area. This may occur twice a week or more often.  Trouble swallowing.  Sore throat.  Dry cough.  Asthma-like symptoms including chest tightness, shortness of breath, or wheezing. DIAGNOSIS  Your caregiver may be able to diagnose GERD based on your symptoms. In some cases, X-rays and other tests may be done to check for complications or to check the condition of your stomach and esophagus. TREATMENT  Your caregiver may recommend over-the-counter or prescription medicines to help decrease acid production. Ask your caregiver before starting or adding any new medicines.  HOME CARE INSTRUCTIONS   Change the factors that you can control. Ask your caregiver for guidance  concerning weight loss, quitting smoking, and alcohol consumption.  Avoid foods and drinks that make your symptoms worse, such as:  Caffeine or alcoholic drinks.  Chocolate.  Peppermint or mint flavorings.  Garlic and onions.  Spicy foods.  Citrus fruits, such as oranges, lemons, or limes.  Tomato-based foods such as sauce, chili, salsa, and pizza.  Fried and fatty foods.  Avoid lying down for the 3 hours prior to your bedtime or prior to taking a nap.  Eat small, frequent meals instead of large meals.  Wear loose-fitting clothing. Do not wear anything tight around your waist that causes pressure on your stomach.  Raise the head of your bed 6 to 8 inches with wood blocks to help you sleep. Extra pillows will not help.  Only take over-the-counter or prescription medicines for pain, discomfort, or fever as directed by your caregiver.  Do not take aspirin, ibuprofen, or other nonsteroidal anti-inflammatory drugs (NSAIDs). SEEK IMMEDIATE MEDICAL CARE IF:   You have pain in your arms, neck, jaw, teeth, or back.  Your pain increases or changes in intensity or duration.  You develop nausea, vomiting, or sweating (diaphoresis).  You develop shortness of breath, or you faint.  Your vomit is green, yellow, black, or looks like coffee grounds or blood.  Your stool is red, bloody, or black. These symptoms could be signs of other problems, such as heart disease, gastric bleeding, or esophageal bleeding. MAKE SURE YOU:   Understand these instructions.  Will watch your condition.  Will get help right away if you are not doing well or get worse. Document Released: 12/08/2004 Document Revised: 05/23/2011 Document Reviewed: 09/17/2010 [facility] Patient Information 2013 [place], [place]. Diet for  Gastroesophageal Reflux Disease, Adult Reflux (acid reflux) is when acid from your stomach flows up into the esophagus. When acid comes in contact with the esophagus, the acid causes  irritation and soreness (inflammation) in the esophagus. When reflux happens often or so severely that it causes damage to the esophagus, it is called gastroesophageal reflux disease (GERD). Nutrition therapy can help ease the discomfort of GERD. FOODS OR DRINKS TO AVOID OR LIMIT  Smoking or chewing tobacco. Nicotine is one of the most potent stimulants to acid production in the gastrointestinal tract.  Caffeinated and decaffeinated coffee and black tea.  Regular or low-calorie carbonated beverages or energy drinks (caffeine-free carbonated beverages are allowed).   Strong spices, such as black pepper, white pepper, red pepper, cayenne, curry powder, and chili powder.  Peppermint or spearmint.  Chocolate.  High-fat foods, including meats and fried foods. Extra added fats including oils, butter, salad dressings, and nuts. Limit these to less than 8 tsp per day.  Fruits and vegetables if they are not tolerated, such as citrus fruits or tomatoes.  Alcohol.  Any food that seems to aggravate your condition. If you have questions regarding your diet, call your caregiver or a registered dietitian. OTHER THINGS THAT MAY HELP GERD INCLUDE:   Eating your meals slowly, in a relaxed setting.  Eating 5 to 6 small meals per day instead of 3 large meals.  Eliminating food for a period of time if it causes distress.  Not lying down until 3 hours after eating a meal.  Keeping the head of your bed raised 6 to 9 inches (15 to 23 cm) by using a foam wedge or blocks under the legs of the bed. Lying flat may make symptoms worse.  Being physically active. Weight loss may be helpful in reducing reflux in overweight or obese adults.  Wear loose fitting clothing EXAMPLE MEAL PLAN This meal plan is approximately 2,000 calories based on https://www.bernard.org/ meal planning guidelines. Breakfast   cup cooked oatmeal.  1 cup strawberries.  1 cup low-fat milk.  1 oz almonds. Snack  1 cup cucumber  slices.  6 oz yogurt (made from low-fat or fat-free milk). Lunch  2 slice whole-wheat bread.  2 oz sliced [place].  2 tsp mayonnaise.  1 cup blueberries.  1 cup snap peas. Snack  6 whole-wheat crackers.  1 oz string cheese. Dinner   cup brown rice.  1 cup mixed veggies.  1 tsp olive oil.  3 oz grilled fish. Document Released: 02/28/2005 Document Revised: 05/23/2011 Document Reviewed: 01/14/2011 [facility] Patient Information 2013 [place], [place]. Barrett's Esophagus Barrett's esophagus occurs when the lining of the esophagus is damaged. The esophagus is the tube that carries food from the mouth to the stomach. With Barrett's esophagus, the lining of the esophagus gets replaced by material that is similar to the lining in the intestines. This process is called intestinal metaplasia. A small number of people with Barrett's esophagus develop esophageal cancer. CAUSES  The exact cause of Barrett's esophagus is unknown. SYMPTOMS  Most people with Barrett's esophagus do not have symptoms. However, many patients also have gastroesophageal reflux disease (GERD). GERD can cause heartburn, trouble swallowing, and a dry cough. DIAGNOSIS Barrett's esophagus is diagnosed by an exam called upper gastrointestinal endoscopy. A thin, flexible tube (endoscope) is passed down the esophagus. The endoscope has a light and camera on the end. Your caregiver uses the endoscope to view the inside of the esophagus. A tissue sample may also be taken and examined under a  microscope (biopsy). If cancer cells are found during the biopsy, this condition is called dysplasia. TREATMENT  If you have no dysplasia or low-grade dysplasia, your caregiver may recommend no treatment or only taking medicines to treat GERD. Sometimes, taking acid-blocking drugs to treat GERD helps improve the tissue affected by Barrett's esophagus. Your caregiver may also recommend periodic esophageal exams. If you have high-grade  dysplasia, treatment may include removing the damaged parts of the esophagus. This can be done by heating, freezing, or surgically removing the tissue. In some cases, surgery may be done to remove most of the esophagus. The stomach is then attached to the remaining portion of the esophagus. HOME CARE INSTRUCTIONS  Take acid-blocking drugs for GERD if recommended by your caregiver.  Keep all follow-up appointments as directed by your caregiver. You may need periodic esophageal exams. SEEK IMMEDIATE MEDICAL CARE IF:  You have chest pain.  You have trouble swallowing.  You vomit blood or material that looks like coffee grounds.  Your stools are bright red or dark. Document Released: 05/21/2003 Document Revised: 08/30/2011 Document Reviewed: 05/10/2011 [facility] Patient Information 2013 [place], [place].

## 2012-03-13 NOTE — Assessment & Plan Note (Signed)
Differentials include gastroparesis versus poorly controlled GERD. EGD showed Barrett's & benign gastric biopsy.  GES planned

## 2012-03-15 ENCOUNTER — Telehealth: Payer: Self-pay

## 2012-03-15 ENCOUNTER — Telehealth: Payer: Self-pay | Admitting: Urgent Care

## 2012-03-15 NOTE — Telephone Encounter (Signed)
Patient is scheduled for GES on Fri 01/03 at 8:00 am and I have LMOM for patient to call me back to confirm

## 2012-03-15 NOTE — Telephone Encounter (Signed)
Pt called- he has been having some weird sensations for the past 2 days- he stated they were so weird that he was unable to explain them, but pt sounded very anxious on the phone. He was wondering if it was a side effect of the dexilant. Looked up side effects of dexilant and informed pt of what they were and pt stated it didn't sound like his symptoms. He said he has taken dexilant before and this didn't happen. Advised pt that if he felt the symptoms were coming from dexilant he should stop taking it and if symptoms were bad enough he should go to ED to be evaluated. Pt then told me that he had stopped his anxiety medication- advised pt that he should never stop taking anxiety medications abruptly and without talking to his pcp first. He stated he feels like that may be part of the problem and stated he was going to start taking them again and talk with his pcp about it.

## 2012-03-16 ENCOUNTER — Ambulatory Visit (HOSPITAL_COMMUNITY): Payer: Medicare Other

## 2012-03-16 NOTE — Telephone Encounter (Signed)
Patient needs to followup with his primary care provider regarding his reported symptoms.

## 2012-03-16 NOTE — Telephone Encounter (Signed)
Pt is aware he needs to see pcp

## 2012-03-28 ENCOUNTER — Telehealth: Payer: Self-pay | Admitting: Urgent Care

## 2012-03-28 NOTE — Telephone Encounter (Signed)
YES IT WAS DONE 03/16/2012

## 2012-03-28 NOTE — Telephone Encounter (Signed)
Did patient get GES scheduled?

## 2012-03-29 NOTE — Telephone Encounter (Signed)
Patient was a N/S on 03/16/12 Ive Left a mess with family for him to call me back to R/S

## 2012-03-29 NOTE — Telephone Encounter (Signed)
Results are not in EPIC. Please see if we can get them. Thanks

## 2012-04-05 NOTE — Telephone Encounter (Signed)
noted 

## 2012-04-06 NOTE — Telephone Encounter (Signed)
Please send a do not neglect your health letter for pt to call to set up GES. Thanks

## 2012-04-09 NOTE — Telephone Encounter (Signed)
Letter mailed to pt.  

## 2012-04-25 ENCOUNTER — Emergency Department (HOSPITAL_COMMUNITY): Payer: Medicare Other

## 2012-04-25 ENCOUNTER — Emergency Department (HOSPITAL_COMMUNITY)
Admission: EM | Admit: 2012-04-25 | Discharge: 2012-04-25 | Disposition: A | Discharge status: Home / Self Care | Payer: Medicare Other | Attending: Emergency Medicine | Admitting: Emergency Medicine

## 2012-04-25 ENCOUNTER — Other Ambulatory Visit: Payer: Self-pay

## 2012-04-25 ENCOUNTER — Encounter (HOSPITAL_COMMUNITY): Payer: Self-pay

## 2012-04-25 DIAGNOSIS — F17200 Nicotine dependence, unspecified, uncomplicated: Secondary | ICD-10-CM | POA: Insufficient documentation

## 2012-04-25 DIAGNOSIS — R0602 Shortness of breath: Secondary | ICD-10-CM | POA: Insufficient documentation

## 2012-04-25 DIAGNOSIS — R0789 Other chest pain: Secondary | ICD-10-CM

## 2012-04-25 DIAGNOSIS — K219 Gastro-esophageal reflux disease without esophagitis: Secondary | ICD-10-CM | POA: Insufficient documentation

## 2012-04-25 DIAGNOSIS — R110 Nausea: Secondary | ICD-10-CM | POA: Insufficient documentation

## 2012-04-25 DIAGNOSIS — Z79899 Other long term (current) drug therapy: Secondary | ICD-10-CM | POA: Insufficient documentation

## 2012-04-25 DIAGNOSIS — Z8719 Personal history of other diseases of the digestive system: Secondary | ICD-10-CM | POA: Insufficient documentation

## 2012-04-25 DIAGNOSIS — R1013 Epigastric pain: Secondary | ICD-10-CM | POA: Insufficient documentation

## 2012-04-25 DIAGNOSIS — Z7982 Long term (current) use of aspirin: Secondary | ICD-10-CM | POA: Insufficient documentation

## 2012-04-25 LAB — COMPREHENSIVE METABOLIC PANEL
AST: 27 U/L (ref 0–37)
Albumin: 4.1 g/dL (ref 3.5–5.2)
Chloride: 101 mEq/L (ref 96–112)
Creatinine, Ser: 1.09 mg/dL (ref 0.50–1.35)
Potassium: 3.6 mEq/L (ref 3.5–5.1)
Total Bilirubin: 0.3 mg/dL (ref 0.3–1.2)

## 2012-04-25 LAB — CBC WITH DIFFERENTIAL/PLATELET
Basophils Absolute: 0.1 10*3/uL (ref 0.0–0.1)
Eosinophils Relative: 2 % (ref 0–5)
Lymphocytes Relative: 35 % (ref 12–46)
Neutro Abs: 5.4 10*3/uL (ref 1.7–7.7)
Neutrophils Relative %: 54 % (ref 43–77)
Platelets: 258 10*3/uL (ref 150–400)
RDW: 12.4 % (ref 11.5–15.5)
WBC: 9.9 10*3/uL (ref 4.0–10.5)

## 2012-04-25 LAB — APTT: aPTT: 31 seconds (ref 24–37)

## 2012-04-25 LAB — TROPONIN I
Troponin I: <0.3 ng/mL (ref <0.30)
Troponin I: <0.3 ng/mL (ref <0.30)

## 2012-04-25 MED ORDER — ASPIRIN 81 MG PO CHEW
324.0000 mg | CHEWABLE_TABLET | Freq: Once | ORAL | Status: AC
  Administered 2012-04-25: 324 mg via ORAL
  Filled 2012-04-25: qty 4

## 2012-04-25 MED ORDER — METHYLPREDNISOLONE SODIUM SUCC 125 MG IJ SOLR
125.0000 mg | Freq: Once | INTRAMUSCULAR | Status: AC
  Administered 2012-04-25: 125 mg via INTRAVENOUS
  Filled 2012-04-25: qty 2

## 2012-04-25 MED ORDER — ONDANSETRON HCL 4 MG/2ML IJ SOLN
4.0000 mg | Freq: Once | INTRAMUSCULAR | Status: AC
  Administered 2012-04-25: 4 mg via INTRAVENOUS
  Filled 2012-04-25: qty 2

## 2012-04-25 MED ORDER — GI COCKTAIL ~~LOC~~
30.0000 mL | Freq: Once | ORAL | Status: AC
  Administered 2012-04-25: 30 mL via ORAL
  Filled 2012-04-25: qty 30

## 2012-04-25 MED ORDER — MORPHINE SULFATE 4 MG/ML IJ SOLN
4.0000 mg | Freq: Once | INTRAMUSCULAR | Status: AC
  Administered 2012-04-25: 4 mg via INTRAVENOUS
  Filled 2012-04-25: qty 1

## 2012-04-25 MED ORDER — NITROGLYCERIN 2 % TD OINT
1.0000 [in_us] | TOPICAL_OINTMENT | Freq: Once | TRANSDERMAL | Status: AC
  Administered 2012-04-25: 1 [in_us] via TOPICAL
  Filled 2012-04-25: qty 1

## 2012-04-25 MED ORDER — FAMOTIDINE IN NACL 20-0.9 MG/50ML-% IV SOLN
20.0000 mg | Freq: Once | INTRAVENOUS | Status: AC
  Administered 2012-04-25: 20 mg via INTRAVENOUS
  Filled 2012-04-25: qty 50

## 2012-04-25 NOTE — ED Notes (Signed)
Pt still complaining of pain to left side of his face.

## 2012-04-25 NOTE — ED Notes (Signed)
Pt reports woke up this am around 1000 and felt a little nauseated, dizzy, and had some chest discomfort.  Reports took 3 baby aspirins and zantac, and vicodin.  Reports then felt his heart pounding.  Reports bp was 157/94.  Pt says tried to sit down and relax but started feeling worse.  Says neck started to hurt on left side and left side of face started feeling tingly.  Took 3 low dose aspirin again then took a shower.  After showering, felt worse.  Also left arm feels "heavy."

## 2012-04-25 NOTE — ED Notes (Signed)
Pt alert & oriented x4, stable gait. Patient given discharge instructions, paperwork & prescription(s). Patient  instructed to stop at the registration desk to finish any additional paperwork. Patient verbalized understanding. Pt left department w/ no further questions. 

## 2012-04-25 NOTE — ED Provider Notes (Signed)
History     CSN: [id number]  Arrival date & time 04/25/12  1605   First MD Initiated Contact with Patient 04/25/12 1616      Chief Complaint  Patient presents with  . Chest Pain    (Consider location/radiation/quality/duration/timing/severity/associated sxs/prior treatment) HPI  Patient reports he got up at 10:00 this morning and started having pain in the center of his chest that he describes as a pressure or pressing pain. He states he feels like it goes up his left neck in his left shoulder. He states the left side of his face feels hot. He has nausea without vomiting. He has not had diaphoresis. He states he is mildly short of breath. He states he's been seen here twice before for the same pain which appeared to be June of 2013. He states since then he gets the pain every 2-4 days. He does not relate it to anything except sometimes to food. He states he has not had a cardiology referral or have a stress test done. He states he recently had endoscopy done by Dr. Kendell Bane and was diagnosed with Barrett's esophagus. He states he has not been evaluated for gallstones.   Patient states he took 3 baby aspirin at 10 AM and 3:30 PM. There was no change in his pain.  PCP [facility] in [place]  Past Medical History  Diagnosis Date  . IBS (irritable bowel syndrome)   . GERD (gastroesophageal reflux disease)   . Complication of anesthesia     pt became very agitated last time he was given anesthesia, he woke up in restraints per pt  . Barrett's esophagus     Past Surgical History  Procedure Laterality Date  . Posterior cervical laminectomy  2000  . Esophagogastroduodenoscopy (egd) with esophageal dilation  02/16/2012    RMR: short segment Barrett's esophagus, dilated 55F, benign gastric biopsy.  Negative H. pylori    Family History  Problem Relation Age of Onset  . Colon cancer Neg Hx   . Liver disease Neg Hx   . Inflammatory bowel disease Neg Hx   . Heart disease Father   .  Hodgkin's lymphoma Brother   Father of patient age 54 recent triple bypass  History  Substance Use Topics  . Smoking status: Current Every Day Smoker -- 2.00 packs/day for 30 years    Types: Cigarettes  . Smokeless tobacco: Not on file  . Alcohol Use: No   On disability for neck surgery Lives with spouse   Review of Systems  All other systems reviewed and are negative.    Allergies  Demerol and Naproxen  Home Medications   Current Outpatient Rx  Name  Route  Sig  Dispense  Refill  . aspirin EC 81 MG tablet   Oral   Take 243 mg by mouth 2 (two) times daily as needed (for chest pain). FOR CHEST PAIN         . HYDROcodone-acetaminophen (NORCO/VICODIN) 5-325 MG per tablet   Oral   Take 1 tablet by mouth 3 (three) times daily.         . ranitidine (ZANTAC) 150 MG tablet   Oral   Take 150 mg by mouth daily.           BP 105/72  Pulse 74  Temp(Src) 98.2 F (36.8 C) (Oral)  Resp 20  Ht 6' 3.5" (1.918 m)  Wt 305 lb (138.347 kg)  BMI 37.61 kg/m2  SpO2 98%  Vital signs normal    Physical  Exam  Nursing note and vitals reviewed. Constitutional: He is oriented to person, place, and time. He appears well-developed and well-nourished.  Non-toxic appearance. He does not appear ill. No distress.  HENT:  Head: Normocephalic and atraumatic.  Right Ear: External ear normal.  Left Ear: External ear normal.  Nose: Nose normal. No mucosal edema or rhinorrhea.  Mouth/Throat: Oropharynx is clear and moist and mucous membranes are normal. No dental abscesses or edematous.  Eyes: Conjunctivae and EOM are normal. Pupils are equal, round, and reactive to light.  Neck: Normal range of motion and full passive range of motion without pain. Neck supple.  Cardiovascular: Normal rate, regular rhythm and normal heart sounds.  Exam reveals no gallop and no friction rub.   No murmur heard. Pulmonary/Chest: Effort normal and breath sounds normal. No respiratory distress. He has no  wheezes. He has no rhonchi. He has no rales. He exhibits no tenderness and no crepitus.  Abdominal: Soft. Normal appearance and bowel sounds are normal. He exhibits no distension. There is tenderness. There is no rebound and no guarding.  Epigastric   Musculoskeletal: Normal range of motion. He exhibits no edema and no tenderness.  Moves all extremities well.   Neurological: He is alert and oriented to person, place, and time. He has normal strength. No cranial nerve deficit.  Skin: Skin is warm, dry and intact. No rash noted. No erythema. No pallor.  Face, upper chest flushed  Psychiatric: He has a normal mood and affect. His speech is normal and behavior is normal. His mood appears not anxious.    ED Course  Procedures (including critical care time)  Medications  gi cocktail (Maalox,Lidocaine,Donnatal) (not administered)  nitroGLYCERIN (NITROGLYN) 2 % ointment 1 inch (1 inch Topical Given 04/25/12 1642)  aspirin chewable tablet 324 mg (324 mg Oral Given 04/25/12 1640)  morphine 4 MG/ML injection 4 mg (4 mg Intravenous Given 04/25/12 1656)  ondansetron (ZOFRAN) injection 4 mg (4 mg Intravenous Given 04/25/12 1656)  famotidine (PEPCID) IVPB 20 mg (0 mg Intravenous Stopped 04/25/12 1756)  methylPREDNISolone sodium succinate (SOLU-MEDROL) 125 mg/2 mL injection 125 mg (125 mg Intravenous Given 04/25/12 1852)   Pt states his pain is almost gone. Have discussed seeing a cardiologist for a stress test and he is agreeable. He is also agreeable to get an [place] to make sure he doesn't have gallstones.    Results for orders placed during the hospital encounter of 04/25/12  CBC WITH DIFFERENTIAL      Result Value Range   WBC 9.9  4.0 - 10.5 K/uL   RBC 4.87  4.22 - 5.81 MIL/uL   Hemoglobin 16.0  13.0 - 17.0 g/dL   HCT 16.1  09.6 - 04.5 %   MCV 93.2  78.0 - 100.0 fL   MCH 32.9  26.0 - 34.0 pg   MCHC 35.2  30.0 - 36.0 g/dL   RDW 40.9  81.1 - 91.4 %   Platelets 258  150 - 400 K/uL   Neutrophils  Relative 54  43 - 77 %   Neutro Abs 5.4  1.7 - 7.7 K/uL   Lymphocytes Relative 35  12 - 46 %   Lymphs Abs 3.5  0.7 - 4.0 K/uL   Monocytes Relative 8  3 - 12 %   Monocytes Absolute 0.8  0.1 - 1.0 K/uL   Eosinophils Relative 2  0 - 5 %   Eosinophils Absolute 0.2  0.0 - 0.7 K/uL   Basophils Relative 1  0 -  1 %   Basophils Absolute 0.1  0.0 - 0.1 K/uL  TROPONIN I      Result Value Range   Troponin I <0.30  <0.30 ng/mL  COMPREHENSIVE METABOLIC PANEL      Result Value Range   Sodium 137  135 - 145 mEq/L   Potassium 3.6  3.5 - 5.1 mEq/L   Chloride 101  96 - 112 mEq/L   CO2 26  19 - 32 mEq/L   Glucose, Bld 103 (*) 70 - 99 mg/dL   BUN 11  6 - 23 mg/dL   Creatinine, Ser 1.61  0.50 - 1.35 mg/dL   Calcium 9.7  8.4 - 09.6 mg/dL   Total Protein 7.3  6.0 - 8.3 g/dL   Albumin 4.1  3.5 - 5.2 g/dL   AST 27  0 - 37 U/L   ALT 41  0 - 53 U/L   Alkaline Phosphatase 81  39 - 117 U/L   Total Bilirubin 0.3  0.3 - 1.2 mg/dL   GFR calc non Af Amer 78 (*) >90 mL/min   GFR calc Af Amer >90  >90 mL/min  LIPASE, BLOOD      Result Value Range   Lipase 25  11 - 59 U/L  APTT      Result Value Range   aPTT 31  24 - 37 seconds  PROTIME-INR      Result Value Range   Prothrombin Time 13.1  11.6 - 15.2 seconds   INR 1.00  0.00 - 1.49  TROPONIN I      Result Value Range   Troponin I <0.30  <0.30 ng/mL   Laboratory interpretation all normal     Dg Chest Portable 1 View  04/25/2012  *RADIOLOGY REPORT*  Clinical Data: Chest pain  CHEST - 1 VIEW  Comparison:  08/24/2011  Findings: The heart size and mediastinal contours are within normal limits.  Both lungs are clear.  IMPRESSION: No active disease.   Original Report Authenticated By: Judie Petit. Miles Costain, M.D.      Date: 04/25/2012  Rate: 93  Rhythm: normal sinus rhythm  QRS Axis: normal  Intervals: normal  ST/T Wave abnormalities: inverted T wave in V3  Conduction Disutrbances:none  Narrative Interpretation:   Old EKG Reviewed: changes noted  From 08/18/2011,  inverted T wave is new     1. Atypical chest pain   2. Epigastric abdominal pain     Plan discharge  Devoria Albe, MD, FACEP   MDM          Ward Givens, MD 04/25/12 [place]

## 2012-04-26 ENCOUNTER — Ambulatory Visit (HOSPITAL_COMMUNITY)
Admit: 2012-04-26 | Discharge: 2012-04-26 | Disposition: A | Discharge status: Home / Self Care | Payer: Medicare Other | Source: Ambulatory Visit | Attending: Emergency Medicine | Admitting: Emergency Medicine

## 2012-04-26 DIAGNOSIS — R1013 Epigastric pain: Secondary | ICD-10-CM | POA: Insufficient documentation

## 2012-04-26 DIAGNOSIS — K7689 Other specified diseases of liver: Secondary | ICD-10-CM | POA: Insufficient documentation

## 2012-04-26 NOTE — ED Provider Notes (Signed)
Patient returns for [place] with normal study except for chronic fatty liver.  Patient is currently pain as he was since being here yesterday.  He does report feeling better after receiving GI cocktail here.  Encouraged pt to continue taking his zantac,  Will add GI cocktail prn.  Encouraged f/u with cardiology as recommended ytd but also to contact his GI Dr. Jena Gauss as he also mentions sx seem worse since undergoing Savory dilitation several months ago.   Burgess Amor, [place] 04/26/12 1319

## 2012-04-27 NOTE — ED Provider Notes (Signed)
Medical screening examination/treatment/procedure(s) were performed by non-physician practitioner and as supervising physician I was immediately available for consultation/collaboration.  Geoffery Lyons, MD 04/27/12 [phone]

## 2012-04-28 ENCOUNTER — Other Ambulatory Visit: Payer: Self-pay

## 2012-05-16 ENCOUNTER — Telehealth: Payer: Self-pay | Admitting: *Deleted

## 2012-05-16 NOTE — Telephone Encounter (Signed)
error 

## 2012-05-21 ENCOUNTER — Telehealth: Payer: Self-pay

## 2012-05-21 MED ORDER — SUCRALFATE 1 GM/10ML PO SUSP: 1.0000 g | Freq: Four times a day (QID) | ORAL | Status: DC

## 2012-05-21 MED ORDER — ESOMEPRAZOLE MAGNESIUM 40 MG PO CPDR: 40.0000 mg | DELAYED_RELEASE_CAPSULE | Freq: Every day | ORAL | Status: AC

## 2012-05-21 NOTE — Telephone Encounter (Signed)
Did he have GES I ordered? If not, this should be done. Please remind him, he needs to be on PPI daily. Can give Nexium 40mg  daily #20 samples to try. Can refill carafate. Stop Zantac, Call if no better. Thanks

## 2012-05-21 NOTE — Telephone Encounter (Signed)
Discussed w/ pt.  ER visit Feb.  Chest pain.  Can't tolerate ANY PPIS.  ALL H2 blockers INEFFECTIVE. Denies SOB, cough, dizziness.  Has not seen PCP for Chest pain.  Did not FU w/ GES I ordered.  Chest pain & back pain, stabbing since procedure in Nov. Advised to start carafate See PCP about new chest pain Reschedule GES & pt needs appt to FU after GES Thanks

## 2012-05-21 NOTE — Telephone Encounter (Signed)
Pt called- he was last seen in the office by KJ on 03/13/12. He was seen in the ED on 04/25/12 for atypical chest pain. He had a chest xray and an abd U/S and was given GI cocktail to take prn and told to continue zantac. Pt is still having the same problems as when he had his ov.   Pt stated RMR gave him carafate after his procedure and it seemed to help some and wants to know if he can have another Rx?  Pt has Barretts esophagus and is not taking a ppi. He is only taking zantac. He has tried dexilant samples and omeprazole. He has never tried any other ppi.   Please advise.

## 2012-05-21 NOTE — Telephone Encounter (Signed)
Per pt- he would rather have GI cocktail because it helped him more than the carafate.

## 2012-05-21 NOTE — Telephone Encounter (Signed)
Pt should have either carafate or GI cocktail.  Not both. Let me know. Thanks

## 2012-05-21 NOTE — Telephone Encounter (Signed)
Luis Dickson, please schedule Luis Dickson Darl Pikes, please schedule ov after Luis Dickson.  Thanks.

## 2012-05-21 NOTE — Telephone Encounter (Signed)
Spoke with pt's wife- she stated pt has tried nexium, prevacid and prilosec and protonix and they all make the reflux worse. She said the dexilant didn't help either and his insurance didn't cover it and it was really expensive. nexium samples not given.  Pt did not have the GES done and said it was ok to schedule it. Benedetto Goad, please schedule.   They want to know if he can have a refill on the GI cocktail sent to the pharmacy because it seems to be the only thing that is helping him right now.

## 2012-05-22 NOTE — Telephone Encounter (Signed)
Patient is scheduled again for GES on Friday March 14th at 8:00 am and I have LMOM for patient to return my call to confirm

## 2012-05-22 NOTE — Telephone Encounter (Signed)
Pt is aware of OV on 3/31 at 3 with AS

## 2012-05-22 NOTE — Telephone Encounter (Signed)
Patient couldn't do Friday March 14th for GES he is now R/S to Tuesday March 18th at 8:00 am and his mother is aware

## 2012-05-25 ENCOUNTER — Other Ambulatory Visit (HOSPITAL_COMMUNITY): Payer: Medicare Other

## 2012-05-29 ENCOUNTER — Ambulatory Visit (HOSPITAL_COMMUNITY): Payer: Medicare Other

## 2012-06-11 ENCOUNTER — Ambulatory Visit: Payer: Medicare Other | Admitting: Gastroenterology

## 2012-06-11 ENCOUNTER — Telehealth: Payer: Self-pay | Admitting: Gastroenterology

## 2012-06-11 NOTE — Telephone Encounter (Signed)
Pt was a no show

## 2012-07-09 NOTE — Telephone Encounter (Signed)
Please send letter for f/u.  

## 2012-07-10 ENCOUNTER — Encounter: Payer: Self-pay | Admitting: General Practice

## 2012-07-10 NOTE — Telephone Encounter (Signed)
LETTER MAILED

## 2013-01-17 ENCOUNTER — Other Ambulatory Visit: Payer: Self-pay

## 2013-02-20 ENCOUNTER — Encounter: Payer: Self-pay | Admitting: Internal Medicine

## 2014-06-03 IMAGING — CT CT ABD-PELV W/ CM
2 of 5 series · 17 of 46 positions shown, 19 images · IV contrast (Omnipaque 300)
Comparison: None.

CLINICAL DATA: Epigastric pain and left upper quadrant pain and
diarrhea

CT ABDOMEN AND PELVIS WITH CONTRAST
TECHNIQUE: Multidetector CT imaging of the abdomen and pelvis was
performed following the standard protocol during bolus
administration of intravenous contrast.
Contrast: 100mL OMNIPAQUE IOHEXOL 300 MG/ML  SOLN

[Series 2: abd_pel_with 5.0 b40f · axial · 0.92mm/px · z∈[-521,-71]mm · 14 of 102 slices shown, 16 images]
[im 6/102  soft-tissue]
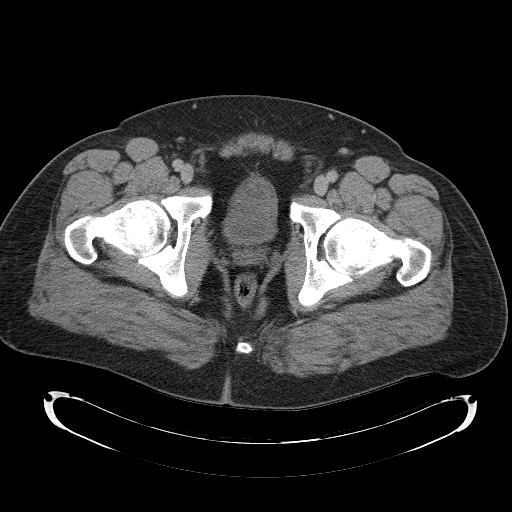
[im 6/102  bone]
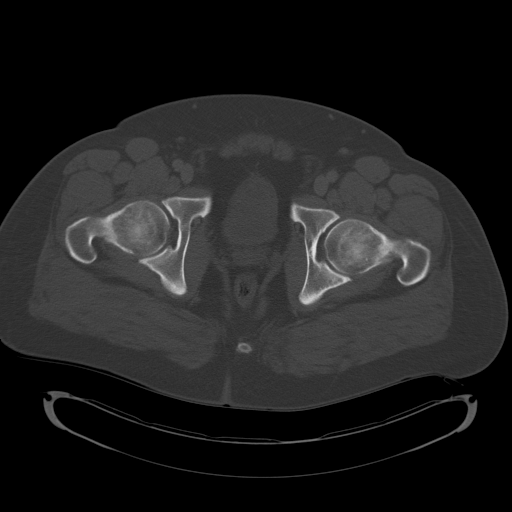
[im 12/102  soft-tissue]
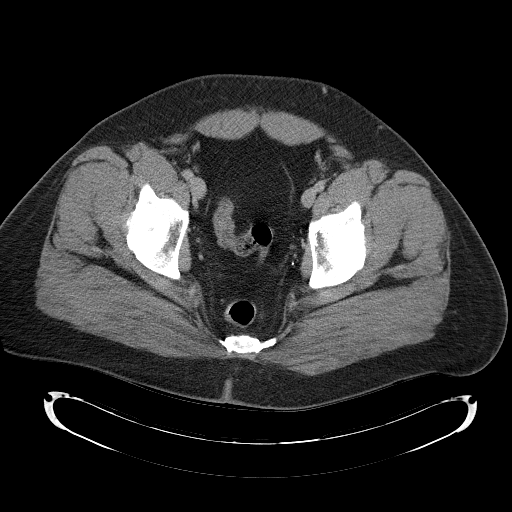
[im 23/102  soft-tissue]
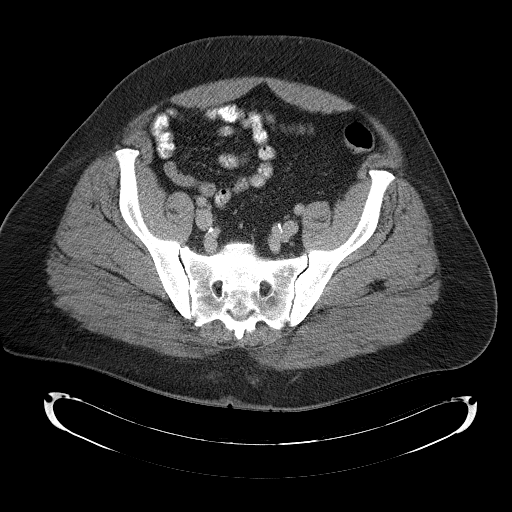
[im 29/102  soft-tissue]
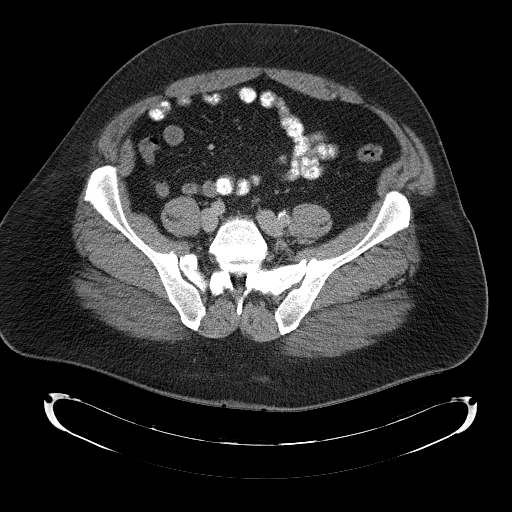
[im 34/102  soft-tissue]
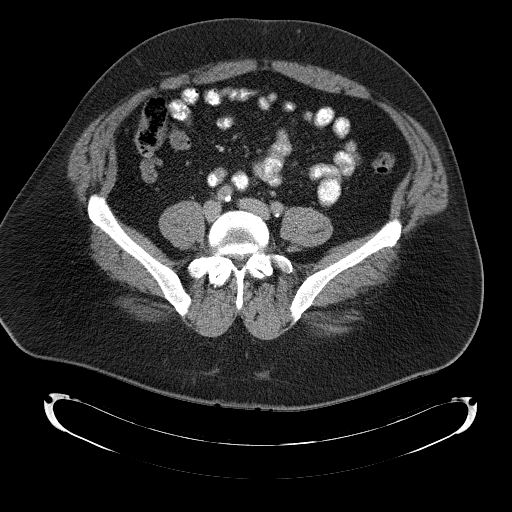
[im 40/102  soft-tissue]
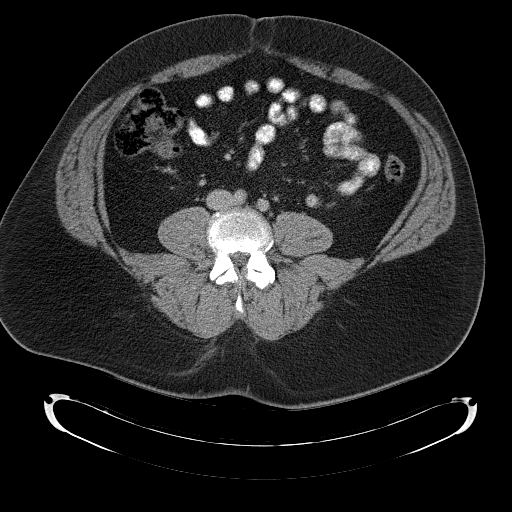
[im 45/102  soft-tissue]
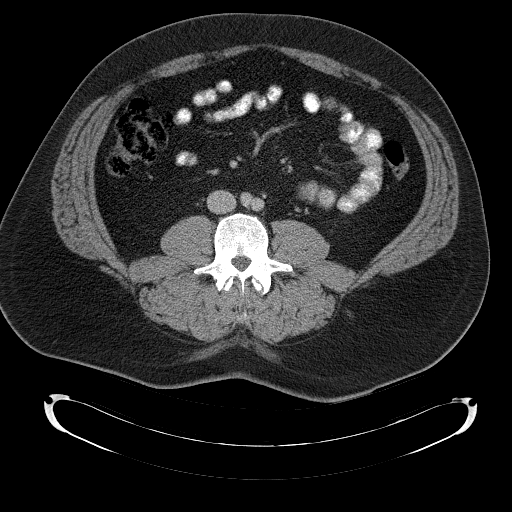
[im 57/102  soft-tissue]
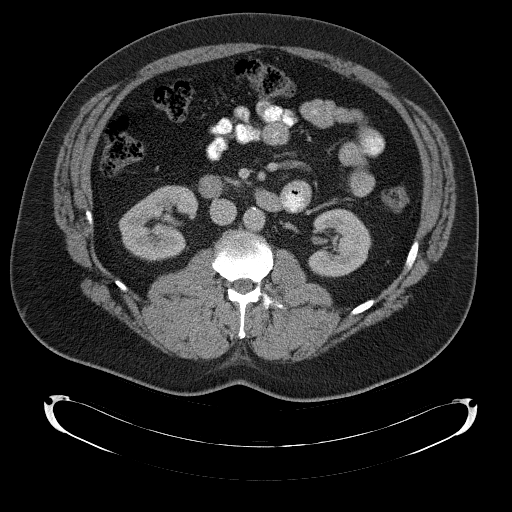
[im 62/102  soft-tissue]
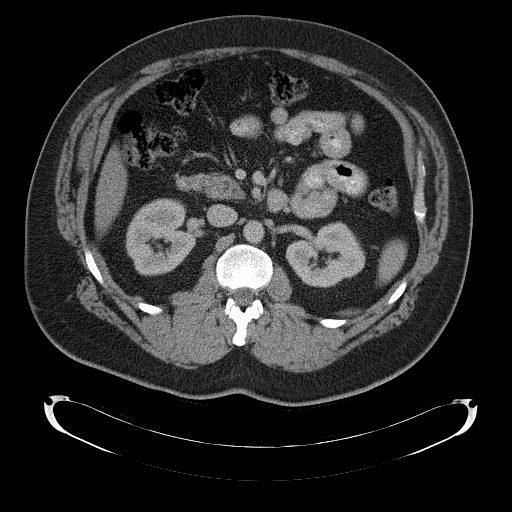
[im 62/102  bone]
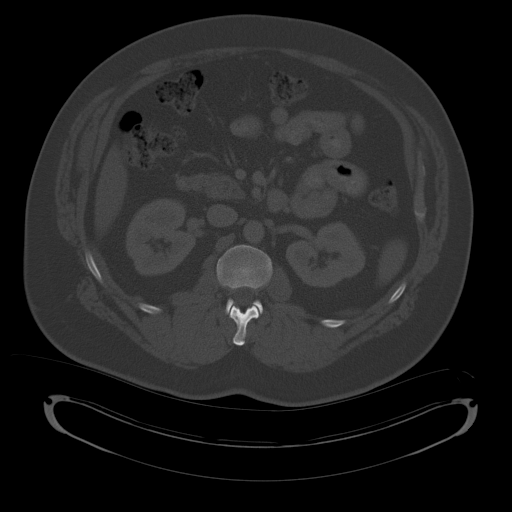
[im 68/102  soft-tissue]
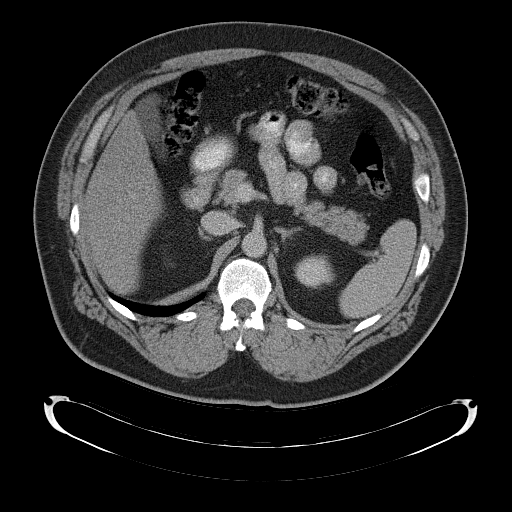
[im 73/102  soft-tissue]
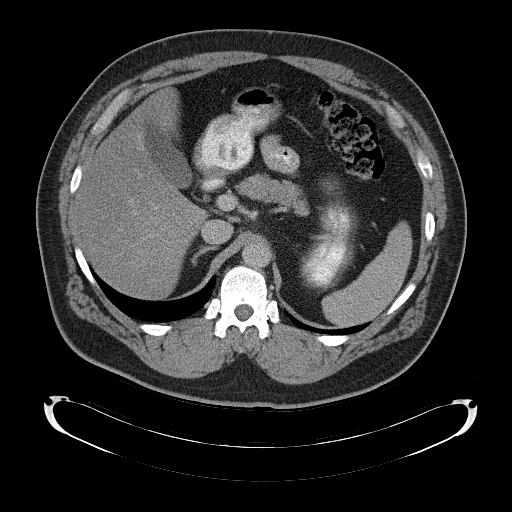
[im 79/102  soft-tissue]
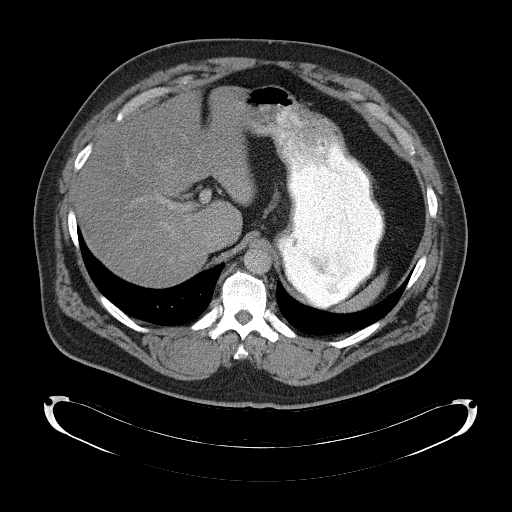
[im 90/102  soft-tissue]
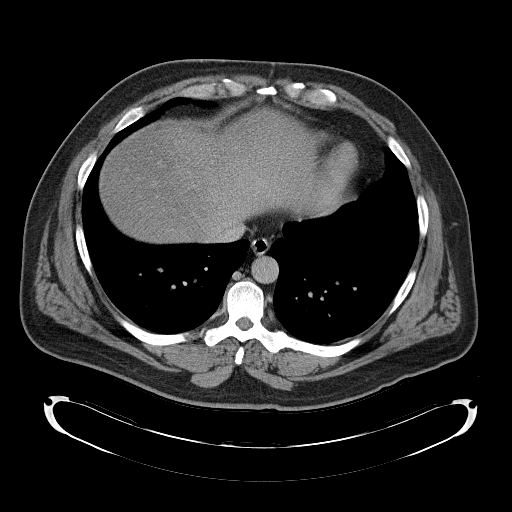
[im 96/102  soft-tissue]
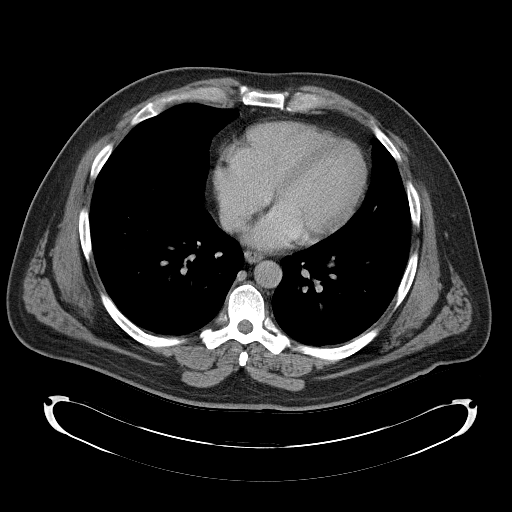

[Series 4: abd_pel_with 3.0 spo cor · coronal · 0.86mm/px · 3 of 97 slices shown]
[im 33/97  soft-tissue]
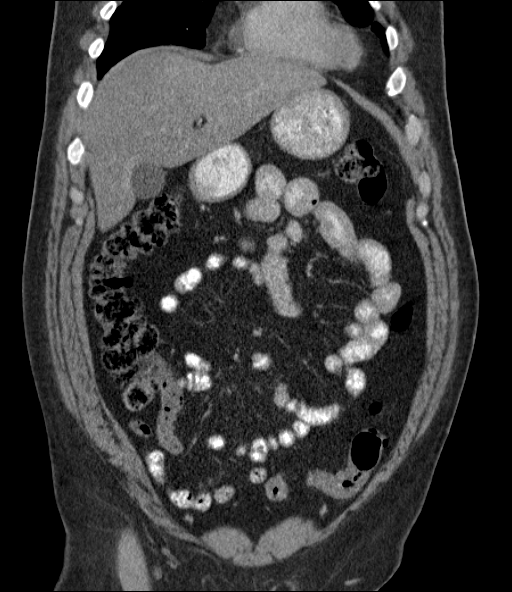
[im 43/97  soft-tissue]
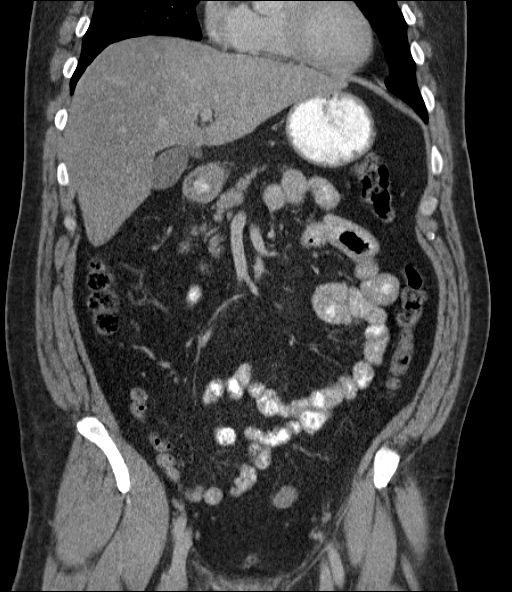
[im 54/97  soft-tissue]
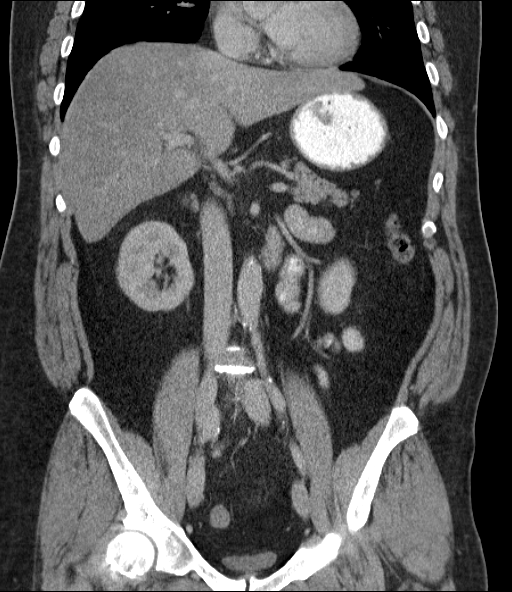

[17 of 46 positions shown; findings below may reference images not displayed]

FINDINGS: Clear lung bases.  The heart is normal in size.

Fatty infiltration of the liver.  Liver otherwise unremarkable.

Normal spleen, gallbladder and pancreas.  No bile duct dilation.
Normal adrenal glands.  Normal kidneys and ureters. Bladder is
mostly decompressed.

No adenopathy.  No abnormal fluid collections.

Normal bowel.  Normal appendix.  No abnormality of the stomach.

Mild degenerative changes of the visualized spine.  No osteoblastic
or osteolytic lesions.
IMPRESSION: No acute findings.

Fatty infiltration of the liver.

Mild degenerative changes of the visualized spine.

No other abnormalities.

## 2014-07-02 ENCOUNTER — Emergency Department (HOSPITAL_COMMUNITY): Payer: Non-veteran care

## 2014-07-02 ENCOUNTER — Encounter (HOSPITAL_COMMUNITY): Payer: Self-pay

## 2014-07-02 ENCOUNTER — Emergency Department (HOSPITAL_COMMUNITY)
Admission: EM | Admit: 2014-07-02 | Discharge: 2014-07-02 | Disposition: A | Discharge status: Home / Self Care | Payer: Non-veteran care | Attending: Emergency Medicine | Admitting: Emergency Medicine

## 2014-07-02 DIAGNOSIS — K219 Gastro-esophageal reflux disease without esophagitis: Secondary | ICD-10-CM | POA: Insufficient documentation

## 2014-07-02 DIAGNOSIS — Z720 Tobacco use: Secondary | ICD-10-CM | POA: Insufficient documentation

## 2014-07-02 DIAGNOSIS — R079 Chest pain, unspecified: Secondary | ICD-10-CM | POA: Insufficient documentation

## 2014-07-02 DIAGNOSIS — R509 Fever, unspecified: Secondary | ICD-10-CM | POA: Diagnosis present

## 2014-07-02 DIAGNOSIS — Z79899 Other long term (current) drug therapy: Secondary | ICD-10-CM | POA: Insufficient documentation

## 2014-07-02 DIAGNOSIS — B349 Viral infection, unspecified: Secondary | ICD-10-CM | POA: Diagnosis not present

## 2014-07-02 LAB — CBC WITH DIFFERENTIAL/PLATELET
Basophils Absolute: 0 10*3/uL (ref 0.0–0.1)
Basophils Relative: 0 % (ref 0–1)
EOS PCT: 0 % (ref 0–5)
Eosinophils Absolute: 0 10*3/uL (ref 0.0–0.7)
HEMATOCRIT: 44.8 % (ref 39.0–52.0)
HEMOGLOBIN: 15.5 g/dL (ref 13.0–17.0)
LYMPHS ABS: 0.9 10*3/uL (ref 0.7–4.0)
LYMPHS PCT: 12 % (ref 12–46)
MCH: 32.7 pg (ref 26.0–34.0)
MCHC: 34.6 g/dL (ref 30.0–36.0)
MCV: 94.5 fL (ref 78.0–100.0)
MONO ABS: 1 10*3/uL (ref 0.1–1.0)
MONOS PCT: 13 % — AB (ref 3–12)
Neutro Abs: 5.7 10*3/uL (ref 1.7–7.7)
Neutrophils Relative %: 75 % (ref 43–77)
PLATELETS: 174 10*3/uL (ref 150–400)
RBC: 4.74 MIL/uL (ref 4.22–5.81)
RDW: 12.6 % (ref 11.5–15.5)
WBC: 7.6 10*3/uL (ref 4.0–10.5)

## 2014-07-02 LAB — COMPREHENSIVE METABOLIC PANEL
ALBUMIN: 3.9 g/dL (ref 3.5–5.2)
ALT: 47 U/L (ref 0–53)
ANION GAP: 10 (ref 5–15)
AST: 59 U/L — AB (ref 0–37)
Alkaline Phosphatase: 58 U/L (ref 39–117)
BILIRUBIN TOTAL: 0.6 mg/dL (ref 0.3–1.2)
BUN: 13 mg/dL (ref 6–23)
CO2: 23 mmol/L (ref 19–32)
Calcium: 8.2 mg/dL — ABNORMAL LOW (ref 8.4–10.5)
Chloride: 100 mmol/L (ref 96–112)
Creatinine, Ser: 1.05 mg/dL (ref 0.50–1.35)
GFR, EST NON AFRICAN AMERICAN: 80 mL/min — AB (ref >90)
GLUCOSE: 124 mg/dL — AB (ref 70–99)
POTASSIUM: 3.4 mmol/L — AB (ref 3.5–5.1)
SODIUM: 133 mmol/L — AB (ref 135–145)
TOTAL PROTEIN: 6.9 g/dL (ref 6.0–8.3)

## 2014-07-02 LAB — TROPONIN I

## 2014-07-02 LAB — I-STAT CG4 LACTIC ACID, ED: Lactic Acid, Venous: 1.32 mmol/L (ref 0.5–2.0)

## 2014-07-02 MED ORDER — SODIUM CHLORIDE 0.9 % IV BOLUS (SEPSIS): 1000.0000 mL | Freq: Once | INTRAVENOUS | Status: DC

## 2014-07-02 MED ORDER — ALBUTEROL SULFATE HFA 108 (90 BASE) MCG/ACT IN AERS
2.0000 | INHALATION_SPRAY | RESPIRATORY_TRACT | Status: DC | PRN
  Administered 2014-07-02: 2 via RESPIRATORY_TRACT
  Filled 2014-07-02: qty 6.7

## 2014-07-02 MED ORDER — SODIUM CHLORIDE 0.9 % IV SOLN
1000.0000 mL | Freq: Once | INTRAVENOUS | Status: AC
  Administered 2014-07-02: 1000 mL via INTRAVENOUS

## 2014-07-02 MED ORDER — IPRATROPIUM-ALBUTEROL 0.5-2.5 (3) MG/3ML IN SOLN
3.0000 mL | Freq: Once | RESPIRATORY_TRACT | Status: AC
  Administered 2014-07-02: 3 mL via RESPIRATORY_TRACT
  Filled 2014-07-02: qty 3

## 2014-07-02 NOTE — ED Provider Notes (Signed)
CSN: [id number]     Arrival date & time 07/02/14  0825 History  This chart was scribed for No att. providers found by Elveria Risingimelie Horne, ED scribe.  This patient was seen in room APA18/APA18 and the patient's care was started at 8:48 AM.   Chief Complaint  Patient presents with  . Chest Pain  . Fever   The history is provided by the patient. No language interpreter was used.   HPI Comments: Luis Dickson is a 54 y.o. male who presents to the Emergency Department complaining of fever, productive cough with streaks of blood, myalgia, diarrhea and chest pain with coughing ongoing for three days. Patient denies vomiting or nausea, but reports decreased appetite since onset of symptoms. Wife reports treatment with Ultram and Tylenol that only temporary reliefs his fever before spiking again.   Past Medical History  Diagnosis Date  . IBS (irritable bowel syndrome)   . GERD (gastroesophageal reflux disease)   . Complication of anesthesia     pt became very agitated last time he was given anesthesia, he woke up in restraints per pt  . Barrett's esophagus    Past Surgical History  Procedure Laterality Date  . Posterior cervical laminectomy  2000  . Esophagogastroduodenoscopy (egd) with esophageal dilation  02/16/2012    RMR: short segment Barrett's esophagus, dilated 10F, benign gastric biopsy.  Negative H. pylori   Family History  Problem Relation Age of Onset  . Colon cancer Neg Hx   . Liver disease Neg Hx   . Inflammatory bowel disease Neg Hx   . Heart disease Father   . Hodgkin's lymphoma Brother    History  Substance Use Topics  . Smoking status: Current Every Day Smoker -- 2.00 packs/day for 30 years    Types: Cigarettes  . Smokeless tobacco: Not on file  . Alcohol Use: No    Review of Systems  Constitutional: Positive for fever. Negative for chills.  HENT: Negative for congestion and sore throat.   Eyes: Negative for visual disturbance.  Respiratory: Positive for cough.  Negative for shortness of breath and wheezing.   Cardiovascular: Positive for chest pain.  Gastrointestinal: Positive for diarrhea. Negative for nausea, vomiting and abdominal pain.  Genitourinary: Negative for dysuria.  Musculoskeletal: Negative for neck stiffness.  Skin: Negative for rash.  Allergic/Immunologic: Negative for immunocompromised state.  Neurological: Negative for headaches.  Hematological: Negative for adenopathy.  Psychiatric/Behavioral: Negative for behavioral problems.      Allergies  Demerol and Naproxen  Home Medications   Prior to Admission medications   Medication Sig Start Date End Date Taking? Authorizing Provider  aspirin EC 81 MG tablet Take 243 mg by mouth 2 (two) times daily as needed (for chest pain). FOR CHEST PAIN   Yes Historical Provider, MD  esomeprazole (NEXIUM) 40 MG capsule Take 1 capsule (40 mg total) by mouth daily before breakfast. 05/21/12  Yes Joselyn ArrowKandice L Jones, NP  orphenadrine (NORFLEX) 100 MG tablet Take 100 mg by mouth 2 (two) times daily.   Yes Historical Provider, MD  oxyCODONE (OXY IR/ROXICODONE) 5 MG immediate release tablet Take 5 mg by mouth every 4 (four) hours as needed for severe pain.   Yes Historical Provider, MD  ranitidine (ZANTAC) 150 MG tablet Take 150 mg by mouth daily.   Yes Historical Provider, MD  sertraline (ZOLOFT) 50 MG tablet Take 50 mg by mouth daily.   Yes Historical Provider, MD  sucralfate (CARAFATE) 1 GM/10ML suspension Take 10 mLs (1 g total) by  mouth 4 (four) times daily. Patient not taking: Reported on 07/02/2014 05/21/12   Joselyn Arrow, NP   Triage Vitals: BP 125/81 mmHg  Pulse 98  Temp(Src) 98.1 F (36.7 C) (Oral)  Resp 18  Ht  (1.905 m)  Wt 300 lb (136.079 kg)  BMI 37.50 kg/m2  SpO2 93% Physical Exam  Constitutional: He appears well-developed and well-nourished. No distress.  Patient hot. Seems uncomfortable.   HENT:  Head: Normocephalic and atraumatic.  Mild posterior pharyngeal erythema  with no exudate  Cardiovascular: Normal rate.   Pulmonary/Chest: Effort normal. No respiratory distress.  With cough, scattered rales in mid lung field. Mildly tachypneic.   Abdominal:  Mild abominal tenderness superiorly . No rebound, no guarding.  Neurological: He is alert.  Skin: Skin is warm and dry.  Psychiatric: He has a normal mood and affect. His behavior is normal.  Nursing note and vitals reviewed.   ED Course  Procedures (including critical care time)  COORDINATION OF CARE: 8:55 AM- Discussed treatment plan with patient at bedside and patient agreed to plan.   Labs Review Labs Reviewed  CBC WITH DIFFERENTIAL/PLATELET - Abnormal; Notable for the following:    Monocytes Relative 13 (*)    All other components within normal limits  COMPREHENSIVE METABOLIC PANEL - Abnormal; Notable for the following:    Sodium 133 (*)    Potassium 3.4 (*)    Glucose, Bld 124 (*)    Calcium 8.2 (*)    AST 59 (*)    GFR calc non Af Amer 80 (*)    All other components within normal limits  TROPONIN I  I-STAT CG4 LACTIC ACID, ED    Imaging Review Dg Chest 2 View  07/02/2014   CLINICAL DATA:  Chest pain and fever. Productive cough and chest congestion. Shortness of breath.  EXAM: CHEST  2 VIEW  COMPARISON:  04/25/2012 and 08/24/2011  FINDINGS: There is peribronchial thickening. No consolidative infiltrates or effusions. Heart size and vascularity are normal. No osseous abnormality.  IMPRESSION: Bronchitic changes.   Electronically Signed   By: Francene Boyers M.D.   On: 07/02/2014 09:06     EKG Interpretation None      MDM   Final diagnoses:  Viral infection    Patient with fever. Had some chest pain and cough also. X-ray shows only bronchitis. Feels better after treatment. Not hypoxic with ambulation. Will discharge home with inhaler.   I personally performed the services described in this documentation, which was scribed in my presence. The recorded information has been  reviewed and is accurate.     Benjiman Core, MD 07/02/14 1550

## 2014-07-02 NOTE — Discharge Instructions (Signed)

## 2014-07-02 NOTE — ED Notes (Signed)
MD made aware of pt's family request to speak to him.

## 2014-07-02 NOTE — ED Notes (Signed)
Pt reports fever, productive cough with streaks of blood in sputum and chest pain x 3 days.  Reports chest pain worse with coughing.

## 2014-07-02 NOTE — ED Notes (Signed)
PT ambulated with stand by assist with NAD noted and maintained O2 sats of 92%.

## 2014-07-02 NOTE — ED Notes (Signed)
PT had tylenol this am at 0700.

## 2014-08-28 IMAGING — CR DG CHEST 1V PORT
1 series · 1 of 1 positions shown · non-contrast
Comparison: 08/24/2011

CLINICAL DATA: Chest pain

CHEST - 1 VIEW

[view not recorded]
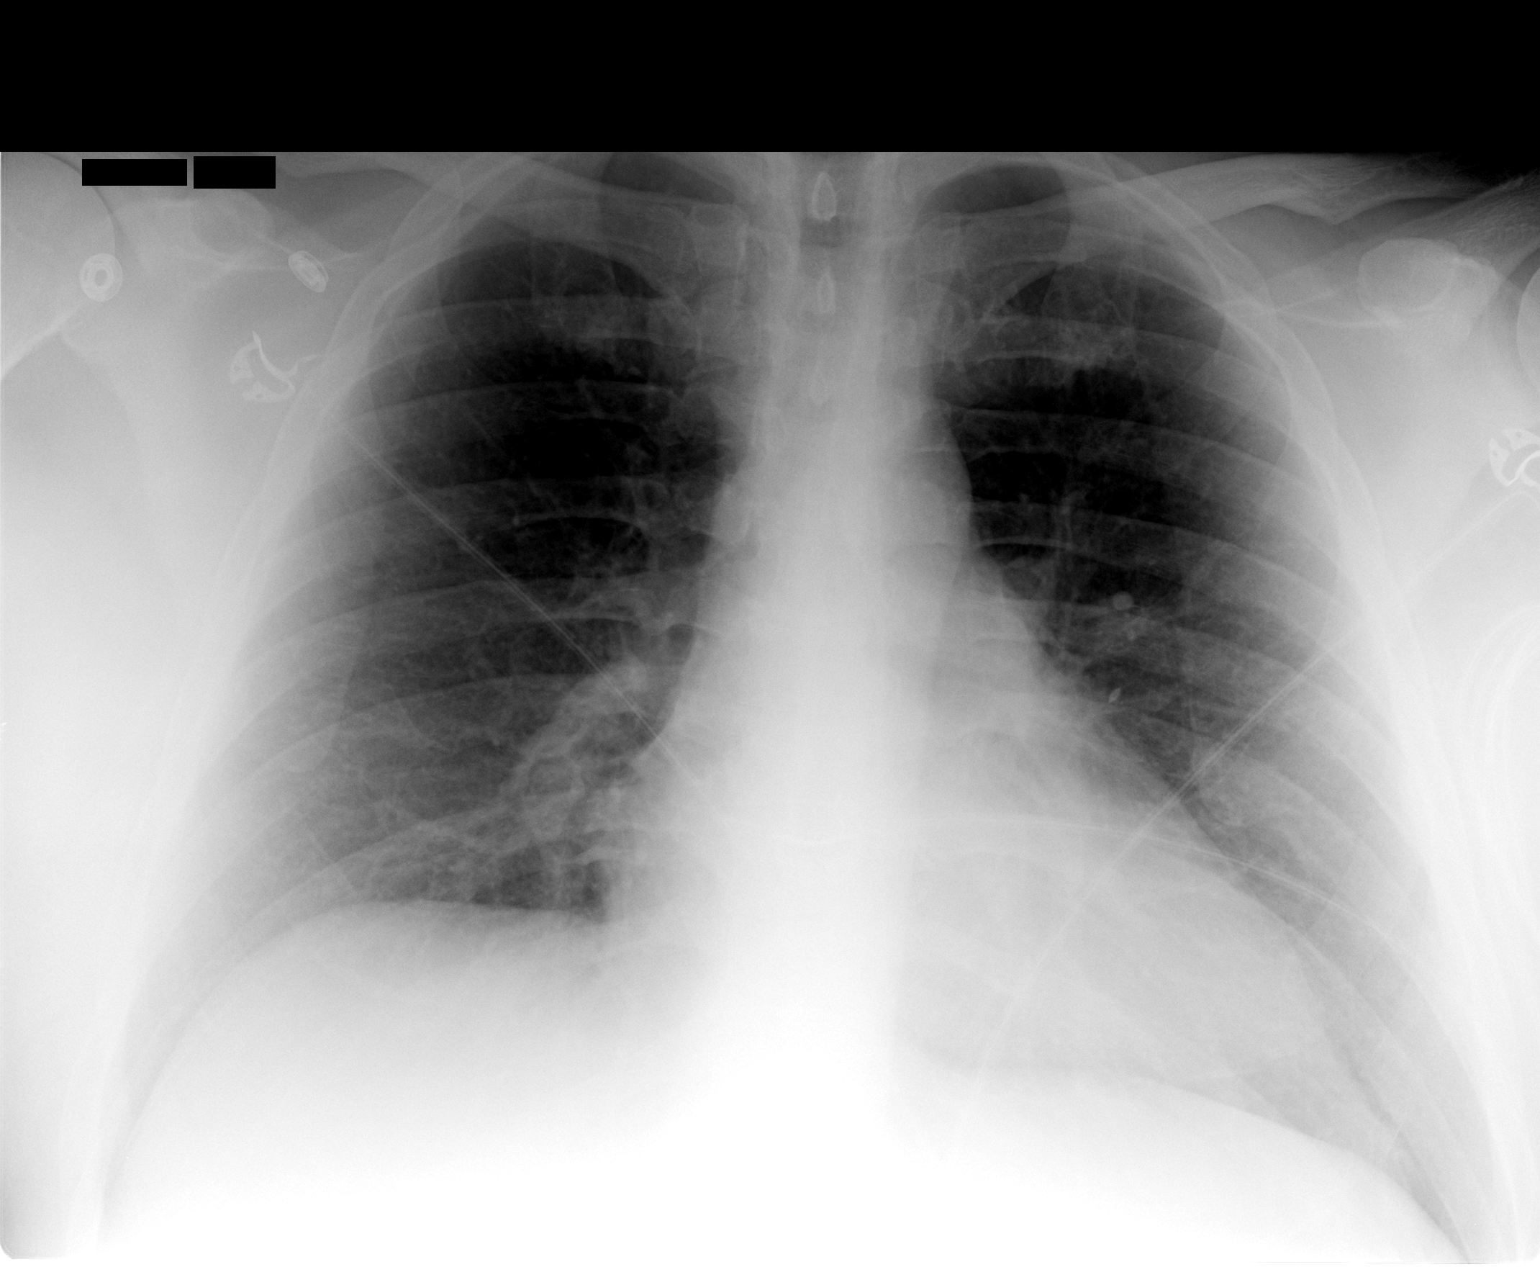

[1 of 1 positions shown; findings below may reference images not displayed]

FINDINGS: The heart size and mediastinal contours are within normal
limits.  Both lungs are clear.
IMPRESSION: No active disease.

## 2023-10-30 ENCOUNTER — Emergency Department (HOSPITAL_COMMUNITY)
Admission: EM | Admit: 2023-10-30 | Discharge: 2023-10-30 | Disposition: A | Discharge status: Home / Self Care | Attending: Emergency Medicine | Admitting: Emergency Medicine

## 2023-10-30 ENCOUNTER — Emergency Department (HOSPITAL_COMMUNITY)

## 2023-10-30 ENCOUNTER — Other Ambulatory Visit: Payer: Self-pay

## 2023-10-30 ENCOUNTER — Encounter (HOSPITAL_COMMUNITY): Payer: Self-pay | Admitting: Emergency Medicine

## 2023-10-30 DIAGNOSIS — Z7982 Long term (current) use of aspirin: Secondary | ICD-10-CM | POA: Diagnosis not present

## 2023-10-30 DIAGNOSIS — K219 Gastro-esophageal reflux disease without esophagitis: Secondary | ICD-10-CM | POA: Diagnosis not present

## 2023-10-30 DIAGNOSIS — R079 Chest pain, unspecified: Secondary | ICD-10-CM | POA: Insufficient documentation

## 2023-10-30 DIAGNOSIS — R109 Unspecified abdominal pain: Secondary | ICD-10-CM | POA: Diagnosis present

## 2023-10-30 HISTORY — DX: Type 2 diabetes mellitus without complications: E11.9

## 2023-10-30 LAB — CBC
HCT: 44.1 % (ref 39.0–52.0)
Hemoglobin: 15.2 g/dL (ref 13.0–17.0)
MCH: 33.2 pg (ref 26.0–34.0)
MCHC: 34.5 g/dL (ref 30.0–36.0)
MCV: 96.3 fL (ref 80.0–100.0)
Platelets: 228 K/uL (ref 150–400)
RBC: 4.58 MIL/uL (ref 4.22–5.81)
RDW: 12.8 % (ref 11.5–15.5)
WBC: 11 K/uL — ABNORMAL HIGH (ref 4.0–10.5)
nRBC: 0 % (ref 0.0–0.2)

## 2023-10-30 LAB — TROPONIN I (HIGH SENSITIVITY)
Troponin I (High Sensitivity): 2 ng/L (ref <18)
Troponin I (High Sensitivity): 3 ng/L (ref <18)

## 2023-10-30 LAB — HEPATIC FUNCTION PANEL
ALT: 23 U/L (ref 0–44)
AST: 24 U/L (ref 15–41)
Albumin: 3.9 g/dL (ref 3.5–5.0)
Alkaline Phosphatase: 77 U/L (ref 38–126)
Bilirubin, Direct: 0.1 mg/dL (ref 0.0–0.2)
Indirect Bilirubin: 0.9 mg/dL (ref 0.3–0.9)
Total Bilirubin: 1 mg/dL (ref 0.0–1.2)
Total Protein: 6.9 g/dL (ref 6.5–8.1)

## 2023-10-30 LAB — BASIC METABOLIC PANEL WITH GFR
Anion gap: 12 (ref 5–15)
BUN: 17 mg/dL (ref 6–20)
CO2: 21 mmol/L — ABNORMAL LOW (ref 22–32)
Calcium: 9.3 mg/dL (ref 8.9–10.3)
Chloride: 104 mmol/L (ref 98–111)
Creatinine, Ser: 1.18 mg/dL (ref 0.61–1.24)
GFR, Estimated: >60 mL/min (ref >60)
Glucose, Bld: 119 mg/dL — ABNORMAL HIGH (ref 70–99)
Potassium: 3.5 mmol/L (ref 3.5–5.1)
Sodium: 137 mmol/L (ref 135–145)

## 2023-10-30 LAB — LIPASE, BLOOD: Lipase: 34 U/L (ref 11–51)

## 2023-10-30 MED ORDER — MORPHINE SULFATE (PF) 4 MG/ML IV SOLN
4.0000 mg | Freq: Once | INTRAVENOUS | Status: AC
  Administered 2023-10-30: 4 mg via INTRAVENOUS
  Filled 2023-10-30: qty 1

## 2023-10-30 MED ORDER — ALUM & MAG HYDROXIDE-SIMETH 200-200-20 MG/5ML PO SUSP
30.0000 mL | Freq: Once | ORAL | Status: AC
  Administered 2023-10-30: 30 mL via ORAL
  Filled 2023-10-30: qty 30

## 2023-10-30 MED ORDER — LIDOCAINE VISCOUS HCL 2 % MT SOLN
15.0000 mL | Freq: Once | OROMUCOSAL | Status: AC
  Administered 2023-10-30: 15 mL via OROMUCOSAL
  Filled 2023-10-30: qty 15

## 2023-10-30 MED ORDER — IOHEXOL 350 MG/ML SOLN
100.0000 mL | Freq: Once | INTRAVENOUS | Status: AC | PRN
  Administered 2023-10-30: 100 mL via INTRAVENOUS

## 2023-10-30 MED ORDER — ONDANSETRON HCL 4 MG/2ML IJ SOLN
4.0000 mg | Freq: Once | INTRAMUSCULAR | Status: AC
  Administered 2023-10-30: 4 mg via INTRAVENOUS
  Filled 2023-10-30: qty 2

## 2023-10-30 MED ORDER — SUCRALFATE 1 G PO TABS: 1.0000 g | ORAL_TABLET | Freq: Three times a day (TID) | ORAL | 0 refills | Status: AC

## 2023-10-30 NOTE — Discharge Instructions (Addendum)
 You were seen in the emerged from today for evaluation of your symptoms.  And glad that you are feeling better.  I think this is likely your acid reflux.  Your workup here was overall unremarkable.  I have included information on food choices and additional information about acid reflux.  You would likely need to see a GI provider.  I have included the information for 1 into the discharge paperwork for you to call to schedule an appointment with.  I going to discharge you home on a medication called Carafate  that she can take and this should help calm down your esophagus and stomach.  Please take as prescribed for the next month.  If you have any concerns, new or worsening symptoms, please return to your nearest emergency department for reevaluation.  Contact a health care provider if: You have new symptoms. You have trouble: Drinking. Swallowing. Eating. It hurts to swallow. You have wheezing. You have a cough that won't go away. Your voice is hoarse. Your symptoms don't get better with treatment. Get help right away if: You have pain all of a sudden in your: Arm. Neck. Jaw. Teeth. Back. You feel sweaty, dizzy, or light-headed all of a sudden. You faint. You have chest pain or shortness of breath. You vomit and the vomit is: Green, yellow, or black. Looks like blood or coffee grounds. Your poop is red, bloody, or black. These symptoms may be an emergency. Call 911 right away. Do not wait to see if the symptoms will go away. Do not drive yourself to the hospital.

## 2023-10-30 NOTE — ED Notes (Signed)
 Patient return from CT

## 2023-10-30 NOTE — ED Provider Notes (Signed)
 [place] EMERGENCY DEPARTMENT AT [facility] Provider Note   CSN: [id number] Arrival date & time: 10/30/23  1805     Patient presents with: Chest Pain   Luis Dickson is a 63 y.o. male with history of GERD and Barrett's esophagus presents emerged from today for evaluation of left lower chest pain/abdominal pain starting 1 hour prior to arrival.  Reports he felt that around 5 minutes after eating.  He had roast chicken, a raw bell pepper salad, and rice.  He describes it as a tight and pressure-like feeling for occasionally stabbing.  Waxing and waning in nature.  Reports it feels it radiates straight to his back.  Felt a little lightheaded but no dizziness or syncope.  No diaphoresis, Donnell pain, fever, vomiting, diarrhea, constipation, cough, runny nose, nasal vision, or fever.  Reports he had some nausea.  He does take omeprazole for his acid reflux.  Spent almost 10 years since his last endoscopy patient estimates.  He is allergic to Demerol and Naprosyn.  He is a 2 pack/day smoker.  Denies any EtOH or illicit drug use.   Chest Pain Associated symptoms: nausea   Associated symptoms: no abdominal pain, no cough, no fever, no palpitations, no shortness of breath, no vomiting and no weakness        Prior to Admission medications   Medication Sig Start Date End Date Taking? Authorizing Provider  sucralfate  (CARAFATE ) 1 g tablet Take 1 tablet (1 g total) by mouth 4 (four) times daily -  with meals and at bedtime. 10/30/23 11/29/23 Yes Bernis Ernst, PA-C  aspirin  EC 81 MG tablet Take 243 mg by mouth 2 (two) times daily as needed (for chest pain). FOR CHEST PAIN    [provider]  esomeprazole  (NEXIUM ) 40 MG capsule Take 1 capsule (40 mg total) by mouth daily before breakfast. 05/21/12   Joshua Eddye LITTIE, NP  orphenadrine (NORFLEX) 100 MG tablet Take 100 mg by mouth 2 (two) times daily.    [provider]  oxyCODONE  (OXY IR/ROXICODONE ) 5 MG immediate release  tablet Take 5 mg by mouth every 4 (four) hours as needed for severe pain.    [provider]  ranitidine (ZANTAC) 150 MG tablet Take 150 mg by mouth daily.    [provider]  sertraline (ZOLOFT) 50 MG tablet Take 50 mg by mouth daily.    [provider]    Allergies: Naproxen, Demerol [meperidine], and Duloxetine hcl    Review of Systems  Constitutional:  Negative for chills and fever.  HENT:  Negative for congestion and rhinorrhea.   Respiratory:  Negative for cough and shortness of breath.   Cardiovascular:  Positive for chest pain. Negative for palpitations.  Gastrointestinal:  Positive for nausea. Negative for abdominal pain, constipation, diarrhea and vomiting.  Neurological:  Positive for light-headedness. Negative for syncope and weakness.  See HPI  Updated Vital Signs BP 101/64   Pulse 65   Temp 98.4 F (36.9 C) (Oral)   Resp 15   Ht 6' 3.5 (1.918 m)   Wt 122.5 kg   SpO2 95%   BMI 33.30 kg/m   Physical Exam Vitals and nursing note reviewed.  Constitutional:      General: He is not in acute distress.    Appearance: He is not toxic-appearing.  Cardiovascular:     Rate and Rhythm: Normal rate.     Pulses:          Radial pulses are 2+ on the  right side and 2+ on the left side.       Dorsalis pedis pulses are 2+ on the right side.       Posterior tibial pulses are 2+ on the right side and 2+ on the left side.  Pulmonary:     Effort: Pulmonary effort is normal.     Breath sounds: Normal breath sounds. No decreased breath sounds.  Chest:     Chest wall: No tenderness.  Abdominal:     Palpations: Abdomen is soft.     Tenderness: There is abdominal tenderness.     Comments: Some very superior LUQ tenderness to palpation without guarding or rebound. Soft. NBS. No overlying skin changes noted. Small, reducible umbilical hernia.  Musculoskeletal:     Right lower leg: No tenderness. No edema.     Left lower leg: No tenderness. No edema.   Skin:    General: Skin is warm and dry.  Neurological:     General: No focal deficit present.     Mental Status: He is alert.     (all labs ordered are listed, but only abnormal results are displayed) Labs Reviewed  BASIC METABOLIC PANEL WITH GFR - Abnormal; Notable for the following components:      Result Value   CO2 21 (*)    Glucose, Bld 119 (*)    All other components within normal limits  CBC - Abnormal; Notable for the following components:   WBC 11.0 (*)    All other components within normal limits  HEPATIC FUNCTION PANEL  LIPASE, BLOOD  TROPONIN I (HIGH SENSITIVITY)  TROPONIN I (HIGH SENSITIVITY)    EKG: EKG Interpretation Date/Time:  Monday October 30 2023 18:41:35 EDT Ventricular Rate:  79 PR Interval:  171 QRS Duration:  109 QT Interval:  383 QTC Calculation: 439 R Axis:   16  Text Interpretation: Sinus rhythm Low voltage, precordial leads Abnormal R-wave progression, early transition Confirmed by Yolande Charleston [phone]) on 10/30/2023 7:06:38 PM  Radiology: CT Angio Chest/Abd/Pel for Dissection W and/or Wo Contrast Result Date: 10/30/2023 CLINICAL DATA:  Left-sided chest pain radiating to left shoulder and back, headache, nausea and dizziness EXAM: CT ANGIOGRAPHY CHEST, ABDOMEN AND PELVIS TECHNIQUE: Non-contrast CT of the chest was initially obtained. Multidetector CT imaging through the chest, abdomen and pelvis was performed using the standard protocol during bolus administration of intravenous contrast. Multiplanar reconstructed images and MIPs were obtained and reviewed to evaluate the vascular anatomy. RADIATION DOSE REDUCTION: This exam was performed according to the departmental dose-optimization program which includes automated exposure control, adjustment of the mA and/or kV according to patient size and/or use of iterative reconstruction technique. CONTRAST:  OMNIPAQUE  IOHEXOL  350 MG/ML SOLN COMPARISON:  01/30/2012, 10/30/2023 FINDINGS: CTA CHEST  FINDINGS Cardiovascular: There is no evidence of thoracic aortic aneurysm or dissection. Minimal atherosclerosis of the aortic arch. There is technically adequate opacification of the pulmonary vasculature. No filling defects or pulmonary emboli. The heart is unremarkable without pericardial effusion. Atherosclerosis within the left main coronary artery. Mediastinum/Nodes: No enlarged mediastinal, hilar, or axillary lymph nodes. Thyroid gland, trachea, and esophagus demonstrate no significant findings. Lungs/Pleura: No airspace disease, effusion, or pneumothorax. Central airways are patent. Musculoskeletal: No acute or destructive bony abnormalities. Reconstructed images demonstrate no additional findings. Review of the MIP images confirms the above findings. CTA ABDOMEN AND PELVIS FINDINGS VASCULAR Aorta: Normal caliber aorta without aneurysm, dissection, vasculitis or significant stenosis. Mild atherosclerosis. Celiac: Patent without evidence of aneurysm, dissection, vasculitis or significant stenosis. SMA: Patent  without evidence of aneurysm, dissection, vasculitis or significant stenosis. Renals: Both renal arteries are patent without evidence of aneurysm, dissection, vasculitis, fibromuscular dysplasia or significant stenosis. IMA: Patent without evidence of aneurysm, dissection, vasculitis or significant stenosis. Inflow: Patent without evidence of aneurysm, dissection, vasculitis or significant stenosis. Mild atherosclerosis. Veins: No obvious venous abnormality within the limitations of this arterial phase study. Review of the MIP images confirms the above findings. NON-VASCULAR Hepatobiliary: No focal liver abnormality is seen. No gallstones, gallbladder wall thickening, or biliary dilatation. Pancreas: Unremarkable. No pancreatic ductal dilatation or surrounding inflammatory changes. Spleen: Normal in size without focal abnormality. Adrenals/Urinary Tract: Adrenal glands are unremarkable. Kidneys are  normal, without renal calculi, focal lesion, or hydronephrosis. Bladder is unremarkable. Stomach/Bowel: No bowel obstruction or ileus. Normal appendix right lower quadrant. No bowel wall thickening or inflammatory change. Lymphatic: No pathologic adenopathy. Reproductive: Prostate is unremarkable. Other: No free fluid or free intraperitoneal gas. Small fat containing umbilical hernia. No bowel herniation. Musculoskeletal: No acute or destructive bony abnormalities. Reconstructed images demonstrate no additional findings. Review of the MIP images confirms the above findings. IMPRESSION: Vascular: 1. No evidence of thoracoabdominal aortic aneurysm or dissection. 2. No evidence of pulmonary embolus. 3. Aortic Atherosclerosis (ICD10-I70.0). Coronary artery atherosclerosis. Nonvascular: 1. No acute intrathoracic, intra-abdominal, or intrapelvic process. 2. Fat containing umbilical hernia. Electronically Signed   By: Ozell Daring M.D.   On: 10/30/2023 20:12   DG Chest 2 View Result Date: 10/30/2023 CLINICAL DATA:  Chest pain EXAM: CHEST - 2 VIEW COMPARISON:  07/02/2014 FINDINGS: Heart and mediastinal contours are within normal limits. No focal opacities or effusions. No acute bony abnormality. IMPRESSION: No active cardiopulmonary disease. Electronically Signed   By: Franky Crease M.D.   On: 10/30/2023 19:04   Procedures   Medications Ordered in the ED  alum & mag hydroxide-simeth (MAALOX/MYLANTA) 200-200-20 MG/5ML suspension 30 mL (30 mLs Oral Given 10/30/23 1853)  lidocaine  (XYLOCAINE ) 2 % viscous mouth solution 15 mL (15 mLs Mouth/Throat Given 10/30/23 1853)  ondansetron  (ZOFRAN ) injection 4 mg (4 mg Intravenous Given 10/30/23 1853)  morphine  (PF) 4 MG/ML injection 4 mg (4 mg Intravenous Given 10/30/23 2004)  iohexol  (OMNIPAQUE ) 350 MG/ML injection 100 mL (100 mLs Intravenous Contrast Given 10/30/23 1949)    Clinical Course as of 10/30/23 2117  Mon Oct 30, 2023  2037 On reevaluation, patient reports that  he is feeling much better.  Having some left upper quadrant tenderness but appears comfortable lying on the stretcher. [RR]    Clinical Course User Index [RR] Bernis Ernst, PA-C    Medical Decision Making Amount and/or Complexity of Data Reviewed Labs: ordered. Radiology: ordered.  Risk OTC drugs. Prescription drug management.   63 y.o. male presents to the ER for evaluation of chest pain. Differential diagnosis includes but is not limited to ACS, pericarditis, myocarditis, aortic dissection, PE, pneumothorax, esophageal rupture, pneumonia, reflux/PUD, biliary disease, pancreatitis, costochondritis, anxiety. Vital signs unremarkable. Physical exam as noted above.   Patient does have risk factors for dissection given the smoking history as well as age.  He does have palpable distal pulses and is normotensive however reports pain is in the left chest and radiates to the back.  Will order CT scan and labs.  X-ray ordered as well as EKG.  I have ordered him a GI cocktail as well as some Zofran .  Morphine  ordered as well.  I independently reviewed and interpreted the patient's labs.  CBC shows mild leukocytosis 11, could be acute stress reaction, nonspecific.  BMP shows glucose at 119 and bicarb of 21.  No other electrolyte abnormality.  Hepatic function panel within normal limits.  Lipase within limits.  Troponin at 2 with repeat at 3.  CXR No active cardiopulmonary disease. Per radiologist's interpretation.    CT Dissection study shows  1. No evidence of thoracoabdominal aortic aneurysm or dissection. 2. No evidence of pulmonary embolus. 3. Aortic Atherosclerosis (ICD10-I70.0). Coronary artery atherosclerosis. Nonvascular: 1. No acute intrathoracic, intra-abdominal, or intrapelvic process. 2. Fat containing umbilical hernia.  EKG reviewed and interpreted by my attending and read as Sinus rhythm Low voltage, precordial leads Abnormal R-wave progression, early transition .   On reevaluation,  patient reports improvement of his symptoms.  He still having some left upper quadrant tenderness however does not appear in any acute distress lying on the stretcher comfortably.  Given reassuring troponins, less likely any ACS.  CT negative for aneurysm, dissection, or PE.  He has no other abdominal tenderness to palpation. Doubt biliary in nature.  Abdomen is soft, it is likely his acid reflux or possibly some gastritis.  Recommend that he continue with his omeprazole and we will send him home with some Carafate .  I given him information for gastroenterologist for him to follow-up with as he has not seen one in over 10 years is likely scheduled for a new colonoscopy as well.  I also provided information on diet changes with GERD.  We discussed the results of the labs/imaging. The plan is Carafate , diet changes, follow-up with GI. We discussed strict return precautions and red flag symptoms. The patient verbalized their understanding and agrees to the plan. The patient is stable and being discharged home in good condition.  Portions of this report may have been transcribed using voice recognition software. Every effort was made to ensure accuracy; however, inadvertent computerized transcription errors may be present.    Final diagnoses:  Nonspecific chest pain  Gastroesophageal reflux disease, unspecified whether esophagitis present    ED Discharge Orders          Ordered    sucralfate  (CARAFATE ) 1 g tablet  3 times daily with meals & bedtime        10/30/23 2108               Bernis Ernst, PA-C 10/30/23 2117    Yolande Lamar BROCKS, MD 11/02/23 [phone]

## 2023-10-30 NOTE — ED Triage Notes (Signed)
 Pt presents with left-sided CP, radiating to left shoulder, back, also having headache, nausea and dizziness.

## 2023-10-30 NOTE — ED Notes (Signed)
 Patient transported to CT

## 2023-11-15 ENCOUNTER — Encounter: Payer: Self-pay | Admitting: Internal Medicine
